# Patient Record
Sex: Female | Born: 1955 | Race: White | Hispanic: No | Marital: Single | Smoking: Current every day smoker
Health system: Southern US, Community
[De-identification: ages and names within clinical notes are randomized; demographics above are authoritative.]

## PROBLEM LIST (undated history)

## (undated) DIAGNOSIS — E785 Hyperlipidemia, unspecified: Secondary | ICD-10-CM

## (undated) DIAGNOSIS — G629 Polyneuropathy, unspecified: Secondary | ICD-10-CM

## (undated) DIAGNOSIS — C50919 Malignant neoplasm of unspecified site of unspecified female breast: Secondary | ICD-10-CM

## (undated) DIAGNOSIS — I10 Essential (primary) hypertension: Secondary | ICD-10-CM

## (undated) DIAGNOSIS — E119 Type 2 diabetes mellitus without complications: Secondary | ICD-10-CM

## (undated) HISTORY — DX: Malignant neoplasm of unspecified site of unspecified female breast: C50.919

## (undated) HISTORY — DX: Polyneuropathy, unspecified: G62.9

## (undated) HISTORY — DX: Essential (primary) hypertension: I10

## (undated) HISTORY — DX: Type 2 diabetes mellitus without complications: E11.9

## (undated) HISTORY — PX: CHOLECYSTECTOMY: SHX55

## (undated) HISTORY — DX: Hyperlipidemia, unspecified: E78.5

## (undated) HISTORY — PX: TUBAL LIGATION: SHX77

---

## 1993-11-09 HISTORY — PX: BREAST RECONSTRUCTION: SHX9

## 1994-01-24 HISTORY — PX: BREAST SURGERY: SHX581

## 2012-04-16 HISTORY — PX: CARDIAC CATHETERIZATION: SHX172

## 2015-03-05 ENCOUNTER — Telehealth: Payer: Self-pay

## 2015-03-05 NOTE — Telephone Encounter (Signed)
See speciality notes 

## 2015-03-06 ENCOUNTER — Ambulatory Visit (INDEPENDENT_AMBULATORY_CARE_PROVIDER_SITE_OTHER): Payer: 59 | Admitting: Family

## 2015-03-06 ENCOUNTER — Encounter: Payer: Self-pay | Admitting: Family

## 2015-03-06 VITALS — BP 96/70 | HR 82 | Temp 98.2°F | Resp 16 | Ht 64.0 in | Wt 156.0 lb

## 2015-03-06 DIAGNOSIS — I10 Essential (primary) hypertension: Secondary | ICD-10-CM

## 2015-03-06 DIAGNOSIS — G43909 Migraine, unspecified, not intractable, without status migrainosus: Secondary | ICD-10-CM | POA: Insufficient documentation

## 2015-03-06 DIAGNOSIS — G43809 Other migraine, not intractable, without status migrainosus: Secondary | ICD-10-CM

## 2015-03-06 DIAGNOSIS — Z0000 Encounter for general adult medical examination without abnormal findings: Secondary | ICD-10-CM | POA: Diagnosis not present

## 2015-03-06 DIAGNOSIS — E785 Hyperlipidemia, unspecified: Secondary | ICD-10-CM | POA: Insufficient documentation

## 2015-03-06 DIAGNOSIS — Z853 Personal history of malignant neoplasm of breast: Secondary | ICD-10-CM

## 2015-03-06 DIAGNOSIS — Z23 Encounter for immunization: Secondary | ICD-10-CM

## 2015-03-06 DIAGNOSIS — E119 Type 2 diabetes mellitus without complications: Secondary | ICD-10-CM

## 2015-03-06 LAB — BASIC METABOLIC PANEL
BUN: 23 mg/dL (ref 6–23)
CHLORIDE: 105 meq/L (ref 96–112)
CO2: 24 meq/L (ref 19–32)
CREATININE: 1.18 mg/dL (ref 0.40–1.20)
Calcium: 10.2 mg/dL (ref 8.4–10.5)
GFR: 49.73 mL/min — ABNORMAL LOW (ref >60.00)
Glucose, Bld: 91 mg/dL (ref 70–99)
POTASSIUM: 4 meq/L (ref 3.5–5.1)
Sodium: 136 mEq/L (ref 135–145)

## 2015-03-06 LAB — HEPATIC FUNCTION PANEL
ALT: 17 U/L (ref 0–35)
AST: 17 U/L (ref 0–37)
Albumin: 4.7 g/dL (ref 3.5–5.2)
Alkaline Phosphatase: 75 U/L (ref 39–117)
BILIRUBIN DIRECT: 0.1 mg/dL (ref 0.0–0.3)
Total Bilirubin: 0.3 mg/dL (ref 0.2–1.2)
Total Protein: 7.5 g/dL (ref 6.0–8.3)

## 2015-03-06 LAB — MICROALBUMIN / CREATININE URINE RATIO
CREATININE, U: 104.6 mg/dL
Microalb Creat Ratio: 0.9 mg/g (ref 0.0–30.0)
Microalb, Ur: 0.9 mg/dL (ref 0.0–1.9)

## 2015-03-06 LAB — LIPID PANEL
CHOL/HDL RATIO: 2
CHOLESTEROL: 146 mg/dL (ref 0–200)
HDL: 65.4 mg/dL (ref >39.00)
LDL Cholesterol: 66 mg/dL (ref 0–99)
NonHDL: 80.6
TRIGLYCERIDES: 74 mg/dL (ref 0.0–149.0)
VLDL: 14.8 mg/dL (ref 0.0–40.0)

## 2015-03-06 LAB — HEMOGLOBIN A1C: HEMOGLOBIN A1C: 7 % — AB (ref 4.6–6.5)

## 2015-03-06 MED ORDER — LISINOPRIL 20 MG PO TABS: 20.0000 mg | ORAL_TABLET | Freq: Every day | ORAL | Status: DC

## 2015-03-06 MED ORDER — ROSUVASTATIN CALCIUM 40 MG PO TABS: 40.0000 mg | ORAL_TABLET | Freq: Every day | ORAL | Status: DC

## 2015-03-06 MED ORDER — TOPIRAMATE 100 MG PO TABS: 200.0000 mg | ORAL_TABLET | Freq: Every day | ORAL | Status: DC

## 2015-03-06 MED ORDER — METFORMIN HCL 1000 MG PO TABS: 1000.0000 mg | ORAL_TABLET | Freq: Two times a day (BID) | ORAL | Status: DC

## 2015-03-06 NOTE — Assessment & Plan Note (Signed)
Repeat manual bp check 98/68, d/c lisinopril-hctz, start plain lisinopril. Follow up in 1 month for bp follow up.

## 2015-03-06 NOTE — Progress Notes (Signed)
Pre visit review using our clinic review tool, if applicable. No additional management support is needed unless otherwise documented below in the visit note. 

## 2015-03-06 NOTE — Assessment & Plan Note (Signed)
Tolerating statin, obtain follow-up lipid panel. 

## 2015-03-06 NOTE — Progress Notes (Signed)
Subjective:    Patient ID: Autumn May, female    DOB: January 26, 1956, 59 y.o.   MRN: [id number]  HPI  Autumn May is a 59 yr old female who presents today to establish care.  Patient presents today for follow up of multiple medical problems.  Diabetes Type 2  Pt was diagnosed 4-5 years ago.   Pt is currently maintained on the following medications for diabetes:metformin Last diabetic eye exam was  Last a1c 09/28/14 was 6.8 + polyuria/polydipsia. Denies hypoglycemia Home glucose readings range rarely checks sugars.   Hyperlipidemia  Patient is currently maintained on the following medication for hyperlipidemia: crestor 40 Patient denies myalgia. Patient reports good compliance with low fat/low cholesterol diet.  Last ldl 108 in november  Hypertension  Patient is currently maintained on the following medications for blood pressure: lisinopril/hctz Patient reports good compliance with blood pressure medications. Patient denies chest pain, shortness of breath or swelling. Last 3 blood pressure readings in our office are as follows: BP Readings from Last 3 Encounters:  03/06/15 96/70   Reports usual BP's around114/72   Migraine Headaches- reports that she is maintained on topamax- has been on for several years and this improved her headache  Breast CA- age 47,  Modified radical mastectomy left  Followed by Chemo.    Reports that she rarely uses meloxicam for occasional hip bursitis and rarely uses valtrex for oral cold sores.  Review of Systems  Constitutional: Negative for unexpected weight change.  HENT: Negative for hearing loss and rhinorrhea.   Eyes: Negative for visual disturbance.  Respiratory: Negative for cough.   Cardiovascular: Negative for leg swelling.  Gastrointestinal: Negative for nausea, diarrhea and constipation.  Genitourinary: Positive for frequency. Negative for dysuria.  Musculoskeletal: Negative for myalgias and arthralgias.  Skin: Negative for  rash.  Neurological: Negative for headaches.       Mild headaches- worse if she is on the computer for work  Hematological: Negative for adenopathy.  Psychiatric/Behavioral: Negative for dysphoric mood and agitation.   Past Medical History  Diagnosis Date  . Hypertension   . Hyperlipidemia   . Breast cancer age: 2  . Diabetes mellitus     type II  . Neuropathy     History   Social History  . Marital Status: Single    Spouse Name: N/A  . Number of Children: N/A  . Years of Education: N/A   Occupational History  . Not on file.   Social History Main Topics  . Smoking status: Current Every Day Smoker -- 1.00 packs/day for 46 years    Types: Cigarettes  . Smokeless tobacco: Never Used  . Alcohol Use: Yes     Comment: 4-5 drinks per month  . Drug Use: No  . Sexual Activity: Not on file   Other Topics Concern  . 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   Enjoys travelling, relaxing          Past Surgical History  Procedure Laterality Date  . Cholecystectomy    . Tubal ligation    . Breast surgery  01/24/94    left mastectomy  . Breast reconstruction  1995    left  . Cardiac catheterization  04/16/12    normal per pt    Family History  Problem Relation Age of Onset  . Stroke Father  Several   . Heart disease Father   . Cancer Father     prostate  . Stroke Sister 65  . Heart disease Sister 61    stents  . Other Mother     brain tumor  . Hyperlipidemia Sister   . Hyperlipidemia Sister     No Known Allergies  No current outpatient prescriptions on file prior to visit.   No current facility-administered medications on file prior to visit.    BP 96/70 mmHg  Pulse 82  Temp(Src) 98.2 F (36.8 C) (Oral)  Resp 16  Ht 5\' 4"  (1.626 m)  Wt 156 lb (70.761 kg)  BMI 26.76 kg/m2  SpO2 94%  LMP 03/06/1995       Objective:   Physical Exam    Constitutional: She is oriented to person, place, and time. She appears well-developed and well-nourished.  HENT:  Head: Normocephalic and atraumatic.  Eyes: No scleral icterus.  Cardiovascular: Normal rate, regular rhythm and normal heart sounds.   No murmur heard. Pulmonary/Chest: Effort normal and breath sounds normal. No respiratory distress. She has no wheezes.  Musculoskeletal: She exhibits no edema.  Neurological: She is alert and oriented to person, place, and time.  Skin: Skin is warm and dry. No rash noted. No erythema. No pallor.  Psychiatric: She has a normal mood and affect. Her behavior is normal. Judgment and thought content normal.          Assessment & Plan:

## 2015-03-06 NOTE — Assessment & Plan Note (Signed)
Obtain a1c, urine microalbumin, bmet, pneumovax today.  Continue metformin.

## 2015-03-06 NOTE — Patient Instructions (Addendum)
Please complete lab work prior to leaving. Stop lisinopril-hctz, start plain lisinopril.  Follow up in  1 month. Welcome to [facility]!

## 2015-03-06 NOTE — Assessment & Plan Note (Signed)
Stable. Continue topamax.

## 2015-03-07 ENCOUNTER — Encounter: Payer: Self-pay | Admitting: Family

## 2015-03-08 ENCOUNTER — Encounter: Payer: Self-pay | Admitting: Family

## 2015-03-08 LAB — URINE CULTURE: Colony Count: 2000

## 2015-04-11 ENCOUNTER — Encounter: Payer: Self-pay | Admitting: Family

## 2015-04-11 ENCOUNTER — Ambulatory Visit (INDEPENDENT_AMBULATORY_CARE_PROVIDER_SITE_OTHER): Payer: 59 | Admitting: Family

## 2015-04-11 VITALS — BP 118/72 | HR 84 | Temp 98.1°F | Resp 16 | Ht 64.0 in | Wt 159.2 lb

## 2015-04-11 DIAGNOSIS — R1013 Epigastric pain: Secondary | ICD-10-CM | POA: Diagnosis not present

## 2015-04-11 DIAGNOSIS — I10 Essential (primary) hypertension: Secondary | ICD-10-CM | POA: Diagnosis not present

## 2015-04-11 DIAGNOSIS — R1010 Upper abdominal pain, unspecified: Secondary | ICD-10-CM

## 2015-04-11 LAB — BASIC METABOLIC PANEL
BUN: 18 mg/dL (ref 6–23)
CALCIUM: 9.4 mg/dL (ref 8.4–10.5)
CO2: 20 meq/L (ref 19–32)
CREATININE: 0.64 mg/dL (ref 0.40–1.20)
Chloride: 107 mEq/L (ref 96–112)
GFR: 100.71 mL/min (ref >60.00)
Glucose, Bld: 118 mg/dL — ABNORMAL HIGH (ref 70–99)
Potassium: 3.9 mEq/L (ref 3.5–5.1)
SODIUM: 136 meq/L (ref 135–145)

## 2015-04-11 LAB — HEPATIC FUNCTION PANEL
ALT: 24 U/L (ref 0–35)
AST: 20 U/L (ref 0–37)
Albumin: 4.5 g/dL (ref 3.5–5.2)
Alkaline Phosphatase: 80 U/L (ref 39–117)
BILIRUBIN DIRECT: 0 mg/dL (ref 0.0–0.3)
BILIRUBIN TOTAL: 0.2 mg/dL (ref 0.2–1.2)
Total Protein: 7.6 g/dL (ref 6.0–8.3)

## 2015-04-11 LAB — LIPASE: LIPASE: 25 U/L (ref 11.0–59.0)

## 2015-04-11 NOTE — Assessment & Plan Note (Signed)
BP looks better on lisinopril only. Continue same, obtain follow up bmet.

## 2015-04-11 NOTE — Assessment & Plan Note (Signed)
Will obtain LFT, lipase and refer for CT abd/pelvis.

## 2015-04-11 NOTE — Addendum Note (Signed)
Addended by: Debbrah Alar on: 04/11/2015 10:09 AM   Modules accepted: Miquel Dunn

## 2015-04-11 NOTE — Patient Instructions (Signed)
Please complete lab work prior to leaving. You will be contacted about your CT scan to evaluate your abdominal discomfort. Follow up in 3 months.

## 2015-04-11 NOTE — Progress Notes (Signed)
Pre visit review using our clinic review tool, if applicable. No additional management support is needed unless otherwise documented below in the visit note. 

## 2015-04-11 NOTE — Progress Notes (Signed)
Subjective:    Patient ID: Autumn May, female    DOB: 11/11/1955, 59 y.o.   MRN: [id number]  HPI  Autumn May is a 59 yr old female who presents today for follow up of hypertension. Last visit her BP was low and lisinopril-hctz was changed to lisinopril.   BP Readings from Last 3 Encounters:  04/11/15 118/72  03/06/15 96/70   Abdominal bloating- reports that his is a chronic problem (several years), but she develops periods of epigastric bloating.  Not related to or worsened by dietary intake or foods.  Has intermittent bulging of upper abdomen and reports that abdomen becomes very rigid and uncomfortable. Discomfort can be moderate to severe in nature.The only thing that helps her pain is to undress and to lay flat.  Reports she saw GI for this about 3 years ago and work up was negative.     Review of Systems See HPI  Past Medical History  Diagnosis Date  . Hypertension   . Hyperlipidemia   . Breast cancer age: 89  . Diabetes mellitus     type II  . Neuropathy     History   Social History  . Marital Status: Single    Spouse Name: N/A  . Number of Children: N/A  . Years of Education: N/A   Occupational History  . Not on file.   Social History Main Topics  . Smoking status: Current Every Day Smoker -- 1.00 packs/day for 46 years    Types: Cigarettes  . Smokeless tobacco: Never Used  . Alcohol Use: Yes     Comment: 4-5 drinks per month  . Drug Use: No  . Sexual Activity: Not on file   Other Topics Concern  . 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   Enjoys travelling, relaxing          Past Surgical History  Procedure Laterality Date  . Cholecystectomy    . Tubal ligation    . Breast surgery  01/24/94    left mastectomy  . Breast reconstruction  1995    left  . Cardiac catheterization  04/16/12    normal per pt    Family History  Problem Relation Age  of Onset  . Stroke Father     Several   . Heart disease Father   . Cancer Father     prostate  . Stroke Sister 54  . Heart disease Sister 23    stents  . Other Mother     brain tumor  . Hyperlipidemia Sister   . Hyperlipidemia Sister     No Known Allergies  Current Outpatient Prescriptions on File Prior to Visit  Medication Sig Dispense Refill  . glucose blood test strip 1 each by Other route daily as needed for other. Use as instructed    . Lancets (FREESTYLE) lancets 1 each by Other route as needed for other. Use as instructed    . lisinopril (PRINIVIL,ZESTRIL) 20 MG tablet Take 1 tablet (20 mg total) by mouth daily. 30 tablet 2  . meloxicam (MOBIC) 15 MG tablet Take 15 mg by mouth daily as needed for pain.    . metFORMIN (GLUCOPHAGE) 1000 MG tablet Take 1 tablet (1,000 mg total) by mouth 2 (two) times daily with a meal. 60 tablet 5  . rosuvastatin (CRESTOR) 40 MG tablet Take 1 tablet (  40 mg total) by mouth daily. 30 tablet 5  . topiramate (TOPAMAX) 100 MG tablet Take 2 tablets (200 mg total) by mouth at bedtime. 60 tablet 5  . valACYclovir (VALTREX) 500 MG tablet Take 500 mg by mouth 2 (two) times daily as needed.     No current facility-administered medications on file prior to visit.    BP 118/72 mmHg  Pulse 84  Temp(Src) 98.1 F (36.7 C) (Oral)  Resp 16  Ht 5\' 4"  (1.626 m)  Wt 159 lb 3.2 oz (72.213 kg)  BMI 27.31 kg/m2  SpO2 97%       Objective:   Physical Exam  Constitutional: She is oriented to person, place, and time. She appears well-developed and well-nourished. No distress.  HENT:  Head: Normocephalic and atraumatic.  Eyes: No scleral icterus.  Musculoskeletal: She exhibits no edema.  Neurological: She is alert and oriented to person, place, and time.  Skin: Skin is warm and dry.  Psychiatric: She has a normal mood and affect. Her behavior is normal. Judgment and thought content normal.          Assessment & Plan:

## 2015-04-13 ENCOUNTER — Encounter (HOSPITAL_BASED_OUTPATIENT_CLINIC_OR_DEPARTMENT_OTHER): Payer: Self-pay

## 2015-04-13 ENCOUNTER — Ambulatory Visit (HOSPITAL_BASED_OUTPATIENT_CLINIC_OR_DEPARTMENT_OTHER)
Admission: RE | Admit: 2015-04-13 | Discharge: 2015-04-13 | Disposition: A | Discharge status: Home / Self Care | Payer: 59 | Source: Ambulatory Visit | Attending: Family | Admitting: Family

## 2015-04-13 DIAGNOSIS — Z9049 Acquired absence of other specified parts of digestive tract: Secondary | ICD-10-CM | POA: Insufficient documentation

## 2015-04-13 DIAGNOSIS — I700 Atherosclerosis of aorta: Secondary | ICD-10-CM | POA: Diagnosis not present

## 2015-04-13 DIAGNOSIS — R1010 Upper abdominal pain, unspecified: Secondary | ICD-10-CM | POA: Diagnosis not present

## 2015-04-13 MED ORDER — IOHEXOL 300 MG/ML  SOLN
100.0000 mL | Freq: Once | INTRAMUSCULAR | Status: AC | PRN
  Administered 2015-04-13: 100 mL via INTRAVENOUS

## 2015-04-15 ENCOUNTER — Telehealth: Payer: Self-pay | Admitting: Family

## 2015-04-15 ENCOUNTER — Encounter: Payer: Self-pay | Admitting: Physician Assistant

## 2015-04-15 ENCOUNTER — Other Ambulatory Visit (INDEPENDENT_AMBULATORY_CARE_PROVIDER_SITE_OTHER): Payer: 59

## 2015-04-15 DIAGNOSIS — R1013 Epigastric pain: Secondary | ICD-10-CM

## 2015-04-15 DIAGNOSIS — R739 Hyperglycemia, unspecified: Secondary | ICD-10-CM | POA: Diagnosis not present

## 2015-04-15 NOTE — Telephone Encounter (Signed)
Notified pt. She will return at 4pm today for lab. Order entered. Pt will see GI on 05/09/15.

## 2015-04-15 NOTE — Telephone Encounter (Signed)
Please let pt know that abdominal CT looks ok and labs look ok, except sugar elevated.  I would like to obtain A1C please (can we add it on?).  I would like to refer her to GI for further evaluation of her pain.

## 2015-04-16 ENCOUNTER — Encounter: Payer: Self-pay | Admitting: Family

## 2015-04-16 LAB — HEMOGLOBIN A1C: HEMOGLOBIN A1C: 6.8 % — AB (ref 4.6–6.5)

## 2015-04-29 ENCOUNTER — Ambulatory Visit: Payer: Self-pay | Admitting: Family

## 2015-05-09 ENCOUNTER — Ambulatory Visit: Payer: Self-pay | Admitting: Physician Assistant

## 2015-05-13 ENCOUNTER — Other Ambulatory Visit: Payer: Self-pay | Admitting: Family

## 2015-07-12 ENCOUNTER — Ambulatory Visit (INDEPENDENT_AMBULATORY_CARE_PROVIDER_SITE_OTHER): Payer: 59 | Admitting: Family

## 2015-07-12 ENCOUNTER — Encounter: Payer: Self-pay | Admitting: Family

## 2015-07-12 VITALS — BP 90/60 | HR 87 | Temp 98.1°F | Resp 16 | Ht 64.0 in | Wt 155.0 lb

## 2015-07-12 DIAGNOSIS — I10 Essential (primary) hypertension: Secondary | ICD-10-CM

## 2015-07-12 DIAGNOSIS — E785 Hyperlipidemia, unspecified: Secondary | ICD-10-CM | POA: Diagnosis not present

## 2015-07-12 DIAGNOSIS — Z23 Encounter for immunization: Secondary | ICD-10-CM | POA: Diagnosis not present

## 2015-07-12 DIAGNOSIS — E119 Type 2 diabetes mellitus without complications: Secondary | ICD-10-CM

## 2015-07-12 LAB — BASIC METABOLIC PANEL
BUN: 14 mg/dL (ref 6–23)
CO2: 24 meq/L (ref 19–32)
Calcium: 9.6 mg/dL (ref 8.4–10.5)
Chloride: 110 mEq/L (ref 96–112)
Creatinine, Ser: 0.68 mg/dL (ref 0.40–1.20)
GFR: 93.82 mL/min (ref >60.00)
GLUCOSE: 113 mg/dL — AB (ref 70–99)
POTASSIUM: 4.1 meq/L (ref 3.5–5.1)
SODIUM: 142 meq/L (ref 135–145)

## 2015-07-12 LAB — HEMOGLOBIN A1C: Hgb A1c MFr Bld: 6.9 % — ABNORMAL HIGH (ref 4.6–6.5)

## 2015-07-12 MED ORDER — TOPIRAMATE 100 MG PO TABS: 200.0000 mg | ORAL_TABLET | Freq: Every day | ORAL | Status: DC

## 2015-07-12 MED ORDER — ROSUVASTATIN CALCIUM 40 MG PO TABS: 40.0000 mg | ORAL_TABLET | Freq: Every day | ORAL | Status: DC

## 2015-07-12 MED ORDER — LISINOPRIL 20 MG PO TABS: 10.0000 mg | ORAL_TABLET | Freq: Every day | ORAL | Status: DC

## 2015-07-12 NOTE — Assessment & Plan Note (Signed)
BP overtreated. Decrease lisinopril from 20mg  to 10mg .

## 2015-07-12 NOTE — Progress Notes (Signed)
Subjective:    Patient ID: Autumn May, female    DOB: 01-10-1956, 59 y.o.   MRN: [id number]  HPI  [name] is a 59 yr old female who presents today for follow up.   BP Readings from Last 3 Encounters:  07/12/15 90/60  04/11/15 118/72  03/06/15 96/70   Hyperlipidemia- denies myalgia Lab Results  Component Value Date   CHOL 146 03/06/2015   HDL 65.40 03/06/2015   LDLCALC 66 03/06/2015   TRIG 74.0 03/06/2015   CHOLHDL 2 03/06/2015   DM2- she is on metformin. She reports sugars in 120's., no hypoglycemia.  Diet is stable   Lab Results  Component Value Date   HGBA1C 6.8* 04/15/2015   HGBA1C 7.0* 03/06/2015   Lab Results  Component Value Date   MICROALBUR 0.9 03/06/2015   LDLCALC 66 03/06/2015   CREATININE 0.64 04/11/2015    Wt Readings from Last 3 Encounters:  07/12/15 155 lb (70.308 kg)  04/11/15 159 lb 3.2 oz (72.213 kg)  03/06/15 156 lb (70.761 kg)      Review of Systems Past Medical History  Diagnosis Date  . Hypertension   . Hyperlipidemia   . Breast cancer age: 59  . Diabetes mellitus     type II  . Neuropathy     Social History   Social History  . Marital Status: Single    Spouse Name: N/A  . Number of Children: N/A  . Years of Education: N/A   Occupational History  . Not on file.   Social History Main Topics  . Smoking status: Current Every Day Smoker -- 1.00 packs/day for 46 years    Types: Cigarettes  . Smokeless tobacco: Never Used  . Alcohol Use: Yes     Comment: 4-5 drinks per month  . Drug Use: No  . Sexual Activity: Not on file   Other Topics Concern  . 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   Enjoys travelling, relaxing          Past Surgical History  Procedure Laterality Date  . Cholecystectomy    . Tubal ligation    . Breast surgery  01/24/94    left mastectomy  . Breast reconstruction  1995    left  .  Cardiac catheterization  04/16/12    normal per pt    Family History  Problem Relation Age of Onset  . Stroke Father     Several   . Heart disease Father   . Cancer Father     prostate  . Stroke Sister 8  . Heart disease Sister 11    stents  . Other Mother     brain tumor  . Hyperlipidemia Sister   . Hyperlipidemia Sister     No Known Allergies  Current Outpatient Prescriptions on File Prior to Visit  Medication Sig Dispense Refill  . glucose blood test strip 1 each by Other route daily as needed for other. Use as instructed    . Lancets (FREESTYLE) lancets 1 each by Other route as needed for other. Use as instructed    . lisinopril (PRINIVIL,ZESTRIL) 20 MG tablet TAKE 1 TABLET (20 MG TOTAL) BY MOUTH DAILY. 30 tablet 2  . meloxicam (MOBIC) 15 MG tablet Take 15 mg by mouth daily as needed for pain.    . metFORMIN (GLUCOPHAGE) 1000 MG tablet  Take 1 tablet (1,000 mg total) by mouth 2 (two) times daily with a meal. 60 tablet 5  . rosuvastatin (CRESTOR) 40 MG tablet Take 1 tablet (40 mg total) by mouth daily. 30 tablet 5  . topiramate (TOPAMAX) 100 MG tablet Take 2 tablets (200 mg total) by mouth at bedtime. 60 tablet 5  . valACYclovir (VALTREX) 500 MG tablet Take 500 mg by mouth 2 (two) times daily as needed.     No current facility-administered medications on file prior to visit.    BP 90/60 mmHg  Pulse 87  Temp(Src) 98.1 F (36.7 C) (Oral)  Resp 16  Ht 5\' 4"  (1.626 m)  Wt 155 lb (70.308 kg)  BMI 26.59 kg/m2  SpO2 97%       Objective:   Physical Exam  Constitutional: She appears well-developed and well-nourished.  Cardiovascular: Normal rate, regular rhythm and normal heart sounds.   No murmur heard. Pulmonary/Chest: Effort normal and breath sounds normal. No respiratory distress. She has no wheezes.  Psychiatric: She has a normal mood and affect. Her behavior is normal. Judgment and thought content normal.          Assessment & Plan:

## 2015-07-12 NOTE — Patient Instructions (Signed)
Please complete lab work prior to leaving. Decrease lisinopril 20mg  1/2 tab by mouth once daily. Follow up in 1 month for nurse visit BP check, 3 months for office visit.

## 2015-07-12 NOTE — Assessment & Plan Note (Signed)
Stable on metformin, obtain A1C and bmet Flu shot today

## 2015-07-12 NOTE — Assessment & Plan Note (Signed)
At goal, continue crestor.  

## 2015-07-12 NOTE — Progress Notes (Signed)
Pre visit review using our clinic review tool, if applicable. No additional management support is needed unless otherwise documented below in the visit note. 

## 2015-07-15 ENCOUNTER — Encounter: Payer: Self-pay | Admitting: Family

## 2015-07-19 IMAGING — CT CT ABD-PELV W/ CM
2 of 5 series · 17 of 46 positions shown, 19 images · IV contrast (omnipaque)
Comparison: None available

CLINICAL DATA: Acute upper abdominal pain, progressive throughout
the day, history of abdominal hernia repair

EXAM:
CT ABDOMEN AND PELVIS WITH CONTRAST
TECHNIQUE: Multidetector CT imaging of the abdomen and pelvis was performed
using the standard protocol following bolus administration of
intravenous contrast.
CONTRAST:  100mL OMNIPAQUE IOHEXOL 300 MG/ML  SOLN

[Series 2: abd/pelvis 5.0 b31f · axial · 0.74mm/px · z∈[+666,+1076]mm · 14 of 92 slices shown, 16 images]
[im 5/92  soft-tissue]
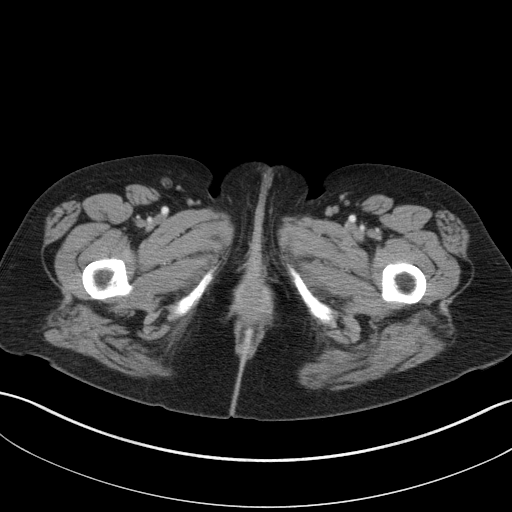
[im 5/92  bone]
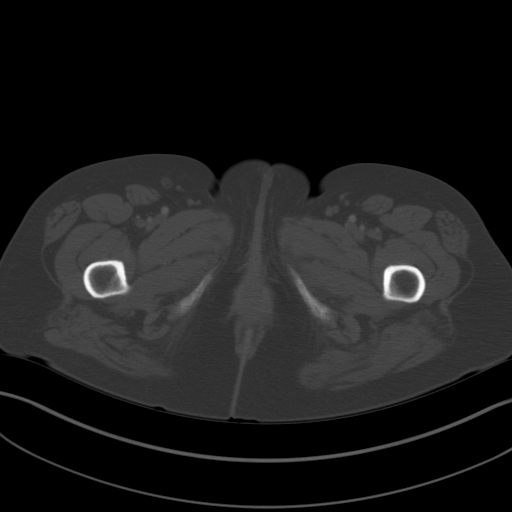
[im 10/92  soft-tissue]
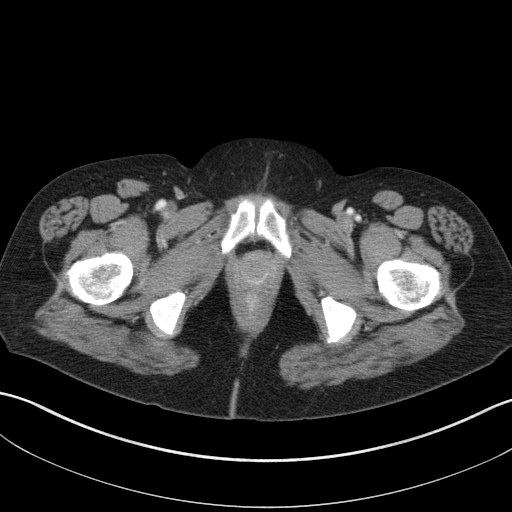
[im 20/92  soft-tissue]
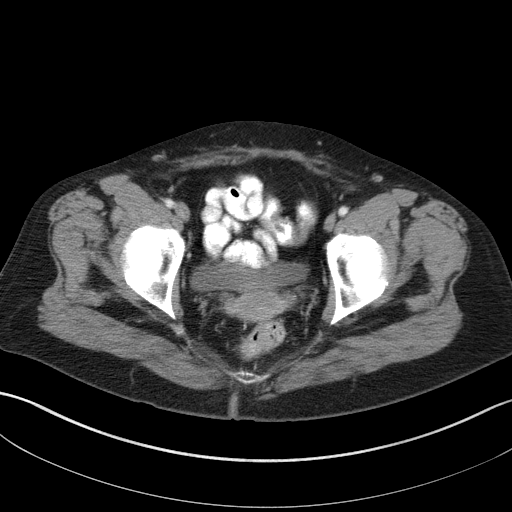
[im 24/92  soft-tissue]
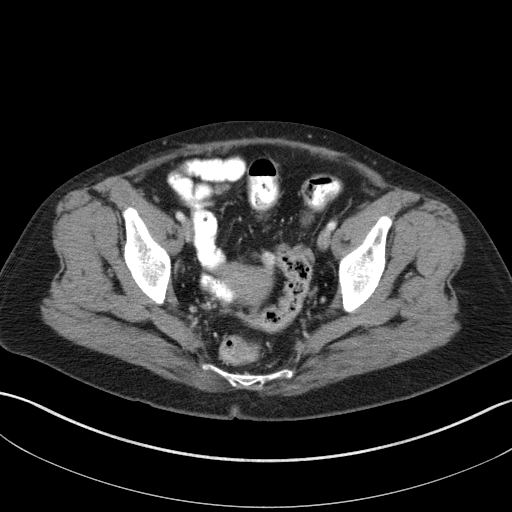
[im 29/92  soft-tissue]
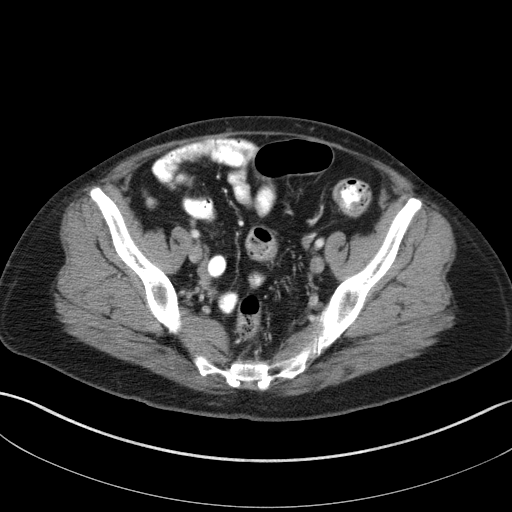
[im 39/92  soft-tissue]
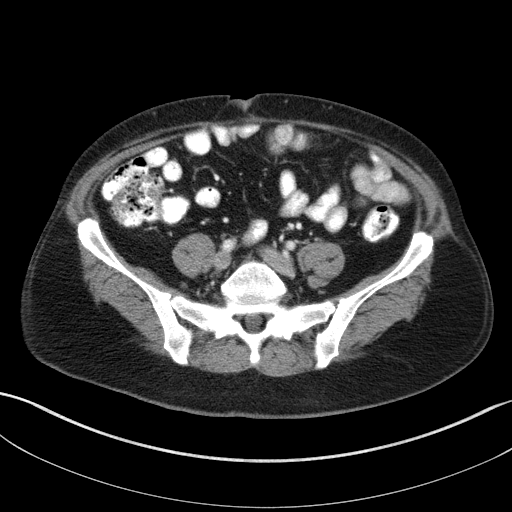
[im 44/92  soft-tissue]
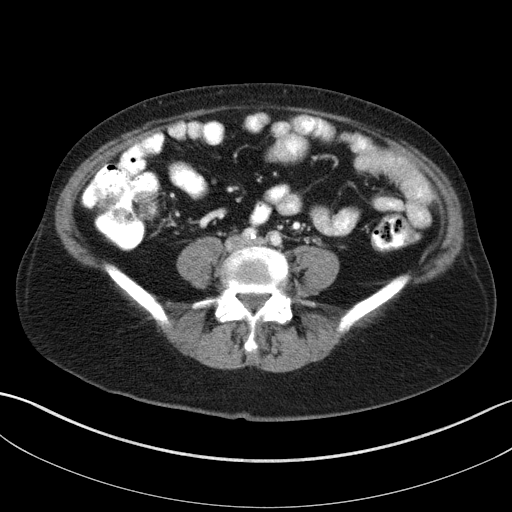
[im 48/92  soft-tissue]
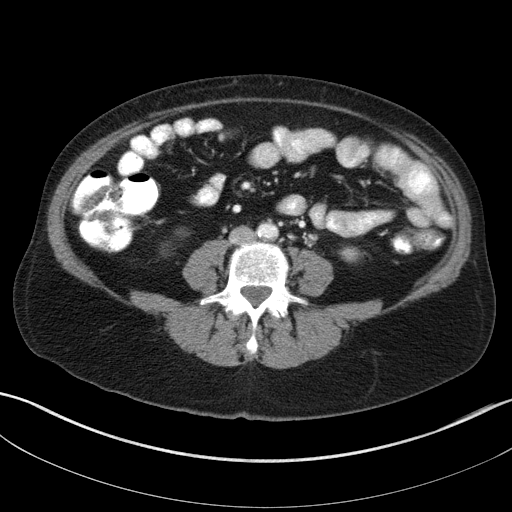
[im 53/92  soft-tissue]
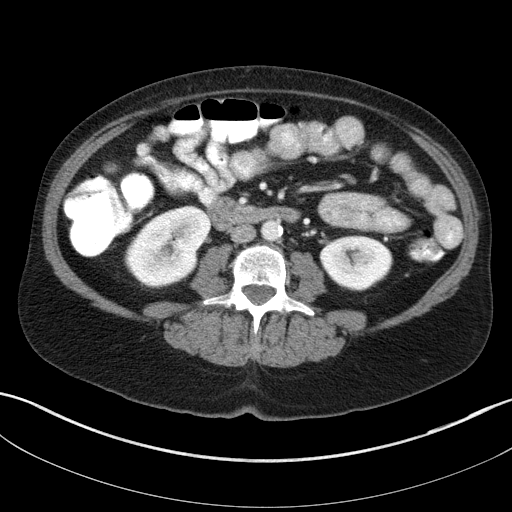
[im 53/92  bone]
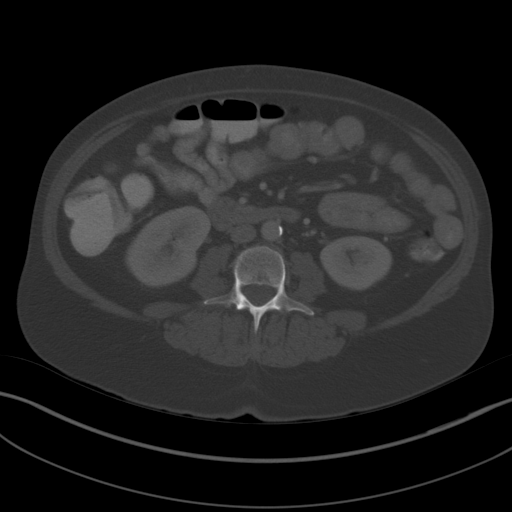
[im 63/92  soft-tissue]
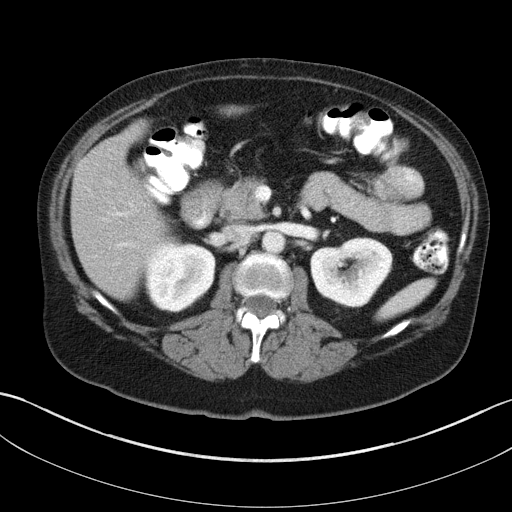
[im 68/92  soft-tissue]
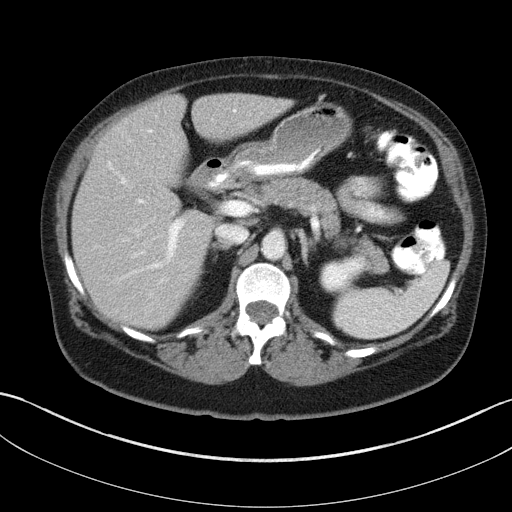
[im 72/92  soft-tissue]
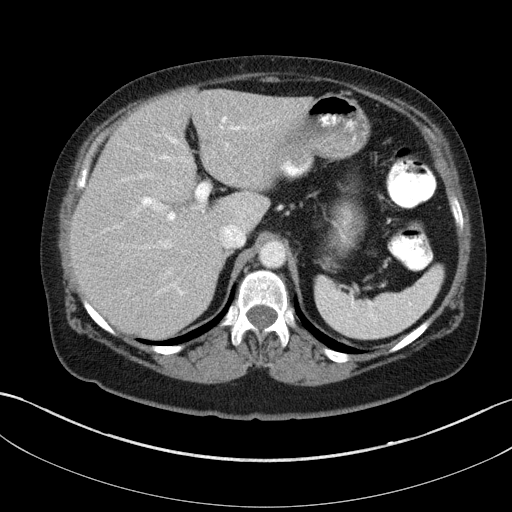
[im 82/92  soft-tissue]
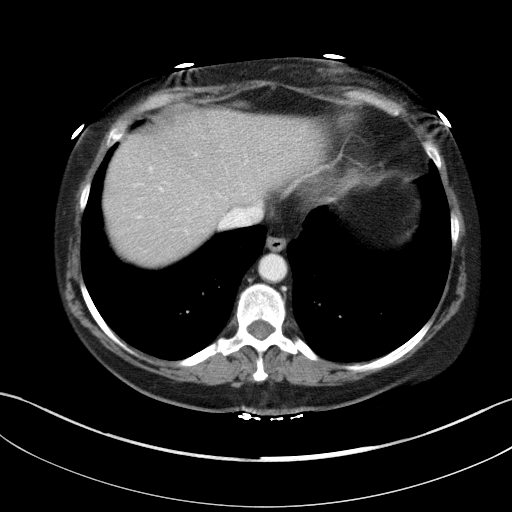
[im 87/92  soft-tissue]
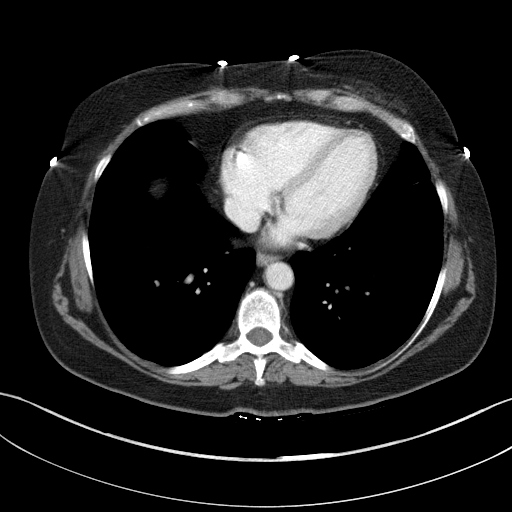

[Series 5: abd/pelvis 3.0 coronal · coronal · 0.87mm/px · 3 of 85 slices shown]
[im 29/85  soft-tissue]
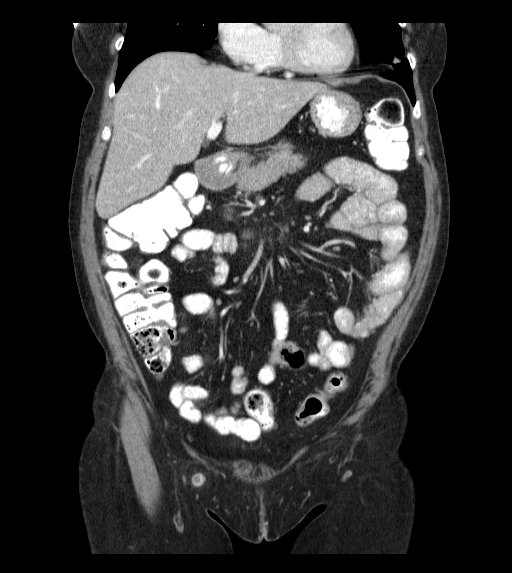
[im 38/85  soft-tissue]
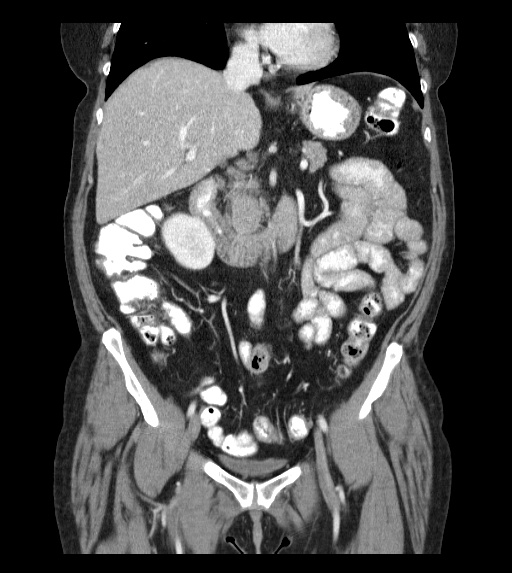
[im 47/85  soft-tissue]
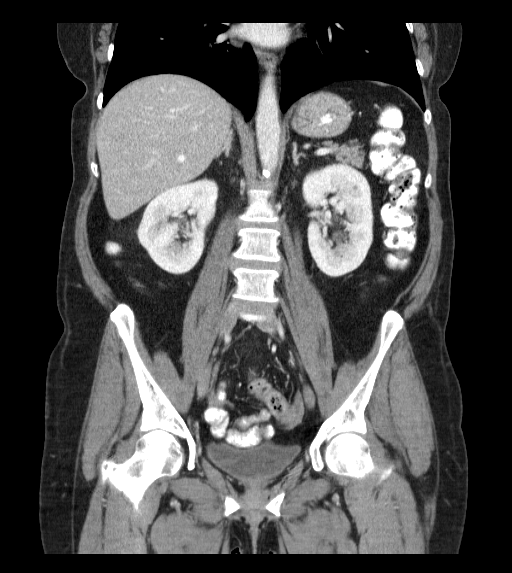

[17 of 46 positions shown; findings below may reference images not displayed]

FINDINGS: Lower chest: Minor inferior lingula and bibasilar atelectasis versus
scarring. No pericardial or pleural effusion. Normal heart size. No
hiatal hernia.

Abdomen: Prior cholecystectomy. Liver, biliary system, pancreas,
spleen, adrenal glands, and kidneys are within normal limits for age
and demonstrate no acute process.

Negative for bowel obstruction, dilatation, ileus, or free air.

No abdominal free fluid, fluid collection, hemorrhage, abscess, or
adenopathy.

Abdominal aortic atherosclerosis without aneurysm or occlusive
process.

Normal appendix in the right lower quadrant.

Pelvis: No pelvic free fluid, fluid collection, hemorrhage, abscess,
adenopathy, inguinal abnormality, or hernia. Urinary bladder is
underdistended. Uterus and adnexal normal in size. No acute distal
bowel process.

No acute osseous finding. Minor L5-S1 facet arthropathy. No pars
defects.
IMPRESSION: No acute intra-abdominal or pelvic finding by CT.

Minor lingula and bibasilar atelectasis versus scarring

Prior cholecystectomy

Aortic atherosclerosis without aneurysm.

## 2015-08-13 ENCOUNTER — Other Ambulatory Visit: Payer: Self-pay | Admitting: Family

## 2015-08-14 ENCOUNTER — Ambulatory Visit (INDEPENDENT_AMBULATORY_CARE_PROVIDER_SITE_OTHER): Payer: 59 | Admitting: Family

## 2015-08-14 VITALS — BP 119/75 | HR 85

## 2015-08-14 DIAGNOSIS — I10 Essential (primary) hypertension: Secondary | ICD-10-CM

## 2015-08-14 LAB — BASIC METABOLIC PANEL
BUN: 18 mg/dL (ref 6–23)
CO2: 24 meq/L (ref 19–32)
Calcium: 9.7 mg/dL (ref 8.4–10.5)
Chloride: 107 mEq/L (ref 96–112)
Creatinine, Ser: 0.66 mg/dL (ref 0.40–1.20)
GFR: 97.08 mL/min (ref >60.00)
Glucose, Bld: 101 mg/dL — ABNORMAL HIGH (ref 70–99)
Potassium: 4.3 mEq/L (ref 3.5–5.1)
SODIUM: 141 meq/L (ref 135–145)

## 2015-08-14 NOTE — Progress Notes (Signed)
Pre visit review using our clinic review tool, if applicable. No additional management support is needed unless otherwise documented below in the visit note.  Patient in for BP check

## 2015-08-14 NOTE — Progress Notes (Signed)
   Subjective:    Patient ID: Autumn May, female    DOB: Apr 19, 1956, 59 y.o.   MRN: [id number]  HPI    Review of Systems     Objective:   Physical Exam        Assessment & Plan:  bp noted. Continue current meds.

## 2015-10-11 ENCOUNTER — Encounter: Payer: Self-pay | Admitting: Family

## 2015-10-11 ENCOUNTER — Ambulatory Visit (INDEPENDENT_AMBULATORY_CARE_PROVIDER_SITE_OTHER): Payer: 59 | Admitting: Family

## 2015-10-11 VITALS — HR 79 | Temp 97.9°F | Resp 18 | Ht 64.0 in | Wt 157.6 lb

## 2015-10-11 DIAGNOSIS — E119 Type 2 diabetes mellitus without complications: Secondary | ICD-10-CM | POA: Diagnosis not present

## 2015-10-11 DIAGNOSIS — E1110 Type 2 diabetes mellitus with ketoacidosis without coma: Secondary | ICD-10-CM

## 2015-10-11 DIAGNOSIS — E785 Hyperlipidemia, unspecified: Secondary | ICD-10-CM | POA: Diagnosis not present

## 2015-10-11 DIAGNOSIS — E1310 Other specified diabetes mellitus with ketoacidosis without coma: Secondary | ICD-10-CM | POA: Diagnosis not present

## 2015-10-11 DIAGNOSIS — G43809 Other migraine, not intractable, without status migrainosus: Secondary | ICD-10-CM | POA: Diagnosis not present

## 2015-10-11 LAB — BASIC METABOLIC PANEL
BUN: 13 mg/dL (ref 6–23)
CO2: 26 meq/L (ref 19–32)
CREATININE: 0.65 mg/dL (ref 0.40–1.20)
Calcium: 9.7 mg/dL (ref 8.4–10.5)
Chloride: 109 mEq/L (ref 96–112)
GFR: 98.76 mL/min (ref >60.00)
GLUCOSE: 122 mg/dL — AB (ref 70–99)
Potassium: 4.4 mEq/L (ref 3.5–5.1)
SODIUM: 142 meq/L (ref 135–145)

## 2015-10-11 LAB — HEMOGLOBIN A1C: Hgb A1c MFr Bld: 7 % — ABNORMAL HIGH (ref 4.6–6.5)

## 2015-10-11 MED ORDER — ASPIRIN 81 MG PO TABS: 81.0000 mg | ORAL_TABLET | Freq: Every day | ORAL | Status: DC

## 2015-10-11 MED ORDER — METFORMIN HCL 1000 MG PO TABS: ORAL_TABLET | ORAL | Status: DC

## 2015-10-11 NOTE — Assessment & Plan Note (Signed)
Clinically stable, obtain follow up A1C. Continue metformin

## 2015-10-11 NOTE — Assessment & Plan Note (Signed)
Stable on topamax, continue same, obtain follow up bmet.  

## 2015-10-11 NOTE — Progress Notes (Signed)
Subjective:    Patient ID: Autumn May, female    DOB: 1956/04/22, 59 y.o.   MRN: [id number]  HPI  Diabetes- currently maintained on metformin.  Lab Results  Component Value Date   HGBA1C 6.9* 07/12/2015   HGBA1C 6.8* 04/15/2015   HGBA1C 7.0* 03/06/2015   Lab Results  Component Value Date   MICROALBUR 0.9 03/06/2015   LDLCALC 66 03/06/2015   CREATININE 0.66 08/14/2015   HTN- currently maintained on lisinopril 20mg .  BP Readings from Last 3 Encounters:  08/14/15 119/75  07/12/15 90/60  04/11/15 118/72   Hyperlipidemia- maintained on crestor.   Lab Results  Component Value Date   CHOL 146 03/06/2015   HDL 65.40 03/06/2015   LDLCALC 66 03/06/2015   TRIG 74.0 03/06/2015   CHOLHDL 2 03/06/2015   Migraines- currently maintained on topamax.     Review of Systems  Respiratory: Negative for shortness of breath.   Cardiovascular: Negative for chest pain.  Musculoskeletal: Negative for myalgias.  Neurological:       Reports migraines have been well controlled.   Past Medical History  Diagnosis Date  . Hypertension   . Hyperlipidemia   . Breast cancer [facility]) age: 51  . Diabetes mellitus ([place])     type II  . Neuropathy [facility])     Social History   Social History  . Marital Status: Single    Spouse Name: N/A  . Number of Children: N/A  . Years of Education: N/A   Occupational History  . Not on file.   Social History Main Topics  . Smoking status: Current Every Day Smoker -- 1.00 packs/day for 46 years    Types: Cigarettes  . Smokeless tobacco: Never Used  . Alcohol Use: Yes     Comment: 4-5 drinks per month  . Drug Use: No  . Sexual Activity: Not on file   Other Topics Concern  . 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   Enjoys travelling, relaxing          Past Surgical History  Procedure Laterality Date  . Cholecystectomy    . Tubal ligation      . Breast surgery  01/24/94    left mastectomy  . Breast reconstruction  1995    left  . Cardiac catheterization  04/16/12    normal per pt    Family History  Problem Relation Age of Onset  . Stroke Father     Several   . Heart disease Father   . Cancer Father     prostate  . Stroke Sister 84  . Heart disease Sister 35    stents  . Other Mother     brain tumor  . Hyperlipidemia Sister   . Hyperlipidemia Sister     No Known Allergies  Current Outpatient Prescriptions on File Prior to Visit  Medication Sig Dispense Refill  . glucose blood test strip 1 each by Other route daily as needed for other. Use as instructed    . Lancets (FREESTYLE) lancets 1 each by Other route as needed for other. Use as instructed    . lisinopril (PRINIVIL,ZESTRIL) 20 MG tablet Take 0.5 tablets (10 mg total) by mouth daily. 45 tablet 1  . meloxicam (MOBIC) 15 MG tablet Take 15 mg by mouth daily as needed for pain.    . rosuvastatin (CRESTOR)  40 MG tablet Take 1 tablet (40 mg total) by mouth daily. 30 tablet 5  . topiramate (TOPAMAX) 100 MG tablet Take 2 tablets (200 mg total) by mouth at bedtime. 60 tablet 5  . valACYclovir (VALTREX) 500 MG tablet Take 500 mg by mouth 2 (two) times daily as needed.     No current facility-administered medications on file prior to visit.    Pulse 79  Temp(Src) 97.9 F (36.6 C) (Oral)  Resp 18  Ht 5\' 4"  (1.626 m)  Wt 157 lb 9.6 oz (71.487 kg)  BMI 27.04 kg/m2  SpO2 97%       Objective:   Physical Exam  Constitutional: She is oriented to person, place, and time. She appears well-developed and well-nourished.  HENT:  Head: Normocephalic and atraumatic.  Eyes: No scleral icterus.  Cardiovascular: Normal rate, regular rhythm and normal heart sounds.   No murmur heard. Pulmonary/Chest: Effort normal and breath sounds normal. No respiratory distress. She has no wheezes.  Abdominal: She exhibits no mass.  Musculoskeletal: She exhibits no edema.   Neurological: She is alert and oriented to person, place, and time.  Psychiatric: She has a normal mood and affect. Her behavior is normal. Judgment and thought content normal.          Assessment & Plan:  She will check zostavax coverage with her insurance.  Plan Td and zostavax next visit.

## 2015-10-11 NOTE — Patient Instructions (Addendum)
Please complete lab work prior to leaving. Add aspirin 81mg  once daily.   Check with your insurance re: coverage for zostavax.

## 2015-10-11 NOTE — Assessment & Plan Note (Signed)
At goal on crestor. Continue same.

## 2015-10-11 NOTE — Progress Notes (Signed)
Pre visit review using our clinic review tool, if applicable. No additional management support is needed unless otherwise documented below in the visit note. 

## 2015-10-13 ENCOUNTER — Encounter: Payer: Self-pay | Admitting: Family

## 2015-10-13 ENCOUNTER — Other Ambulatory Visit: Payer: Self-pay | Admitting: Family

## 2016-01-10 ENCOUNTER — Ambulatory Visit (INDEPENDENT_AMBULATORY_CARE_PROVIDER_SITE_OTHER): Payer: 59 | Admitting: Family

## 2016-01-10 ENCOUNTER — Encounter: Payer: Self-pay | Admitting: Family

## 2016-01-10 VITALS — BP 124/73 | HR 80 | Temp 97.8°F | Resp 16 | Ht 64.0 in | Wt 158.0 lb

## 2016-01-10 DIAGNOSIS — Z23 Encounter for immunization: Secondary | ICD-10-CM | POA: Diagnosis not present

## 2016-01-10 DIAGNOSIS — E119 Type 2 diabetes mellitus without complications: Secondary | ICD-10-CM | POA: Diagnosis not present

## 2016-01-10 DIAGNOSIS — I10 Essential (primary) hypertension: Secondary | ICD-10-CM | POA: Diagnosis not present

## 2016-01-10 DIAGNOSIS — Z0000 Encounter for general adult medical examination without abnormal findings: Secondary | ICD-10-CM

## 2016-01-10 DIAGNOSIS — E785 Hyperlipidemia, unspecified: Secondary | ICD-10-CM

## 2016-01-10 LAB — BASIC METABOLIC PANEL
BUN: 16 mg/dL (ref 6–23)
CO2: 25 meq/L (ref 19–32)
Calcium: 9.5 mg/dL (ref 8.4–10.5)
Chloride: 110 mEq/L (ref 96–112)
Creatinine, Ser: 0.69 mg/dL (ref 0.40–1.20)
GFR: 92.1 mL/min (ref >60.00)
GLUCOSE: 135 mg/dL — AB (ref 70–99)
POTASSIUM: 4.4 meq/L (ref 3.5–5.1)
Sodium: 142 mEq/L (ref 135–145)

## 2016-01-10 LAB — LIPID PANEL
CHOL/HDL RATIO: 2
Cholesterol: 146 mg/dL (ref 0–200)
HDL: 65.2 mg/dL (ref >39.00)
LDL Cholesterol: 69 mg/dL (ref 0–99)
NONHDL: 81.26
Triglycerides: 61 mg/dL (ref 0.0–149.0)
VLDL: 12.2 mg/dL (ref 0.0–40.0)

## 2016-01-10 LAB — MICROALBUMIN / CREATININE URINE RATIO
Creatinine,U: 140.9 mg/dL
Microalb Creat Ratio: 0.6 mg/g (ref 0.0–30.0)
Microalb, Ur: 0.9 mg/dL (ref 0.0–1.9)

## 2016-01-10 LAB — HEMOGLOBIN A1C: Hgb A1c MFr Bld: 7.1 % — ABNORMAL HIGH (ref 4.6–6.5)

## 2016-01-10 MED ORDER — LISINOPRIL 20 MG PO TABS: 10.0000 mg | ORAL_TABLET | Freq: Every day | ORAL | Status: DC

## 2016-01-10 MED ORDER — ROSUVASTATIN CALCIUM 40 MG PO TABS: 40.0000 mg | ORAL_TABLET | Freq: Every day | ORAL | Status: DC

## 2016-01-10 NOTE — Progress Notes (Signed)
Pre visit review using our clinic review tool, if applicable. No additional management support is needed unless otherwise documented below in the visit note. 

## 2016-01-10 NOTE — Assessment & Plan Note (Signed)
BP stable on lisinopril, continue same, obtain bmet.  

## 2016-01-10 NOTE — Assessment & Plan Note (Signed)
Clinically stable on metformin, continue same, obtain a1c and urine microalbumin, continue ACE for renal protection.

## 2016-01-10 NOTE — Assessment & Plan Note (Signed)
Tolerating statin, reports insurance transitioned her to generic crestor.  She is worried cholesterol is back up.  Obtain follow up flp.

## 2016-01-10 NOTE — Patient Instructions (Signed)
Please complete lab work prior to leaving.   

## 2016-01-10 NOTE — Progress Notes (Signed)
Subjective:    Patient ID: Autumn May, female    DOB: 1956/07/19, 60 y.o.   MRN: [id number]  HPI  Autumn May is a 60 yr old female who presents today for follow up.  1) DM2- currently maintained on metformin. Not checking sugars at home Lab Results  Component Value Date   HGBA1C 7.0* 10/11/2015   HGBA1C 6.9* 07/12/2015   HGBA1C 6.8* 04/15/2015   Lab Results  Component Value Date   MICROALBUR 0.9 03/06/2015   LDLCALC 66 03/06/2015   CREATININE 0.65 10/11/2015   2) HTN- on lisinopril.  BP Readings from Last 3 Encounters:  01/10/16 124/73  08/14/15 119/75  07/12/15 90/60   3) Hyperlipidemia- on crestor. Lab Results  Component Value Date   CHOL 146 03/06/2015   HDL 65.40 03/06/2015   LDLCALC 66 03/06/2015   TRIG 74.0 03/06/2015   CHOLHDL 2 03/06/2015      Review of Systems  Respiratory: Negative for cough and shortness of breath.   Cardiovascular: Negative for chest pain and leg swelling.  Musculoskeletal: Negative for myalgias.      see HPI  Past Medical History  Diagnosis Date  . Hypertension   . Hyperlipidemia   . Breast cancer [facility]) age: 29  . Diabetes mellitus ([place])     type II  . Neuropathy [facility])     Social History   Social History  . Marital Status: Single    Spouse Name: N/A  . Number of Children: N/A  . Years of Education: N/A   Occupational History  . Not on file.   Social History Main Topics  . Smoking status: Current Every Day Smoker -- 1.00 packs/day for 46 years    Types: Cigarettes  . Smokeless tobacco: Never Used  . Alcohol Use: Yes     Comment: 4-5 drinks per month  . Drug Use: No  . Sexual Activity: Not on file   Other Topics Concern  . 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   Enjoys travelling, relaxing          Past Surgical History  Procedure Laterality Date  . Cholecystectomy    . Tubal ligation    .  Breast surgery  01/24/94    left mastectomy  . Breast reconstruction  1995    left  . Cardiac catheterization  04/16/12    normal per pt    Family History  Problem Relation Age of Onset  . Stroke Father     Several   . Heart disease Father   . Cancer Father     prostate  . Stroke Sister 1  . Heart disease Sister 24    stents  . Other Mother     brain tumor  . Hyperlipidemia Sister   . Hyperlipidemia Sister     No Known Allergies  Current Outpatient Prescriptions on File Prior to Visit  Medication Sig Dispense Refill  . lisinopril (PRINIVIL,ZESTRIL) 20 MG tablet Take 0.5 tablets (10 mg total) by mouth daily. 45 tablet 1  . meloxicam (MOBIC) 15 MG tablet Take 15 mg by mouth daily as needed for pain.    . metFORMIN (GLUCOPHAGE) 1000 MG tablet TAKE 1 TABLET (1,000 MG TOTAL) BY MOUTH 2 (TWO) TIMES DAILY WITH A MEAL. 180 tablet 1  . rosuvastatin (CRESTOR) 40 MG tablet Take 1 tablet (40 mg total)  by mouth daily. 30 tablet 5  . topiramate (TOPAMAX) 100 MG tablet Take 2 tablets (200 mg total) by mouth at bedtime. 60 tablet 5  . aspirin 81 MG tablet Take 1 tablet (81 mg total) by mouth daily. (Patient not taking: Reported on 01/10/2016) 30 tablet   . glucose blood test strip 1 each by Other route daily as needed for other. Reported on 01/10/2016    . Lancets (FREESTYLE) lancets 1 each by Other route as needed for other. Reported on 01/10/2016    . valACYclovir (VALTREX) 500 MG tablet Take 500 mg by mouth 2 (two) times daily as needed. Reported on 01/10/2016     No current facility-administered medications on file prior to visit.    BP 124/73 mmHg  Pulse 80  Temp(Src) 97.8 F (36.6 C) (Oral)  Resp 16  Ht 5\' 4"  (1.626 m)  Wt 158 lb (71.668 kg)  BMI 27.11 kg/m2  SpO2 98%    Objective:   Physical Exam  Constitutional: She is oriented to person, place, and time. She appears well-developed and well-nourished.  Cardiovascular: Normal rate, regular rhythm and normal heart sounds.   No  murmur heard. Pulmonary/Chest: Effort normal and breath sounds normal. No respiratory distress. She has no wheezes.  Musculoskeletal: She exhibits no edema.  Neurological: She is alert and oriented to person, place, and time.  Psychiatric: She has a normal mood and affect. Her behavior is normal. Judgment and thought content normal.          Assessment & Plan:  zostavax today

## 2016-01-30 ENCOUNTER — Other Ambulatory Visit: Payer: Self-pay | Admitting: Family

## 2016-01-30 ENCOUNTER — Ambulatory Visit (HOSPITAL_BASED_OUTPATIENT_CLINIC_OR_DEPARTMENT_OTHER)
Admission: RE | Admit: 2016-01-30 | Discharge: 2016-01-30 | Disposition: A | Discharge status: Home / Self Care | Payer: 59 | Source: Ambulatory Visit | Attending: Family | Admitting: Family

## 2016-01-30 DIAGNOSIS — Z0000 Encounter for general adult medical examination without abnormal findings: Secondary | ICD-10-CM

## 2016-01-30 DIAGNOSIS — Z1231 Encounter for screening mammogram for malignant neoplasm of breast: Secondary | ICD-10-CM | POA: Diagnosis not present

## 2016-03-13 ENCOUNTER — Other Ambulatory Visit: Payer: Self-pay | Admitting: Family

## 2016-03-13 NOTE — Telephone Encounter (Signed)
LVM for pt advising of the below. ALSO advised pt to schedule FU with PCP

## 2016-03-13 NOTE — Telephone Encounter (Signed)
Topamax refill sent to pharmacy. Pt last seen by PCP on 01/10/16 and advised to f/u in 3 months. Pt has not scheduled yet. Please call her to schedule appt around 04/11/16.  Thanks!

## 2016-04-11 ENCOUNTER — Other Ambulatory Visit: Payer: Self-pay | Admitting: Family

## 2016-04-13 NOTE — Telephone Encounter (Signed)
Rx sent to the pharmacy by e-script.//AB/CMA 

## 2016-05-11 ENCOUNTER — Other Ambulatory Visit: Payer: Self-pay | Admitting: Family

## 2016-05-11 NOTE — Telephone Encounter (Signed)
Refill sent per [facility] refill protocol/SLS  

## 2016-05-25 ENCOUNTER — Telehealth: Payer: Self-pay | Admitting: Family

## 2016-05-25 MED ORDER — VALACYCLOVIR HCL 500 MG PO TABS: 500.0000 mg | ORAL_TABLET | Freq: Two times a day (BID) | ORAL | Status: AC | PRN

## 2016-05-25 NOTE — Telephone Encounter (Signed)
°  Relation to [id number] Call back number:[phone] Pharmacy:[facility]  Reason for call: PT IS NEEDING RX valACYclovir (VALTREX) 500 MG tablet, PT STATES SHE ONLY TAKES AS NEEDED.

## 2016-05-25 NOTE — Telephone Encounter (Signed)
Melissa--looks like medication is on med list as historical but we have not sent Rx before.  Please advise?

## 2016-06-17 ENCOUNTER — Ambulatory Visit (INDEPENDENT_AMBULATORY_CARE_PROVIDER_SITE_OTHER): Payer: 59 | Admitting: Family

## 2016-06-17 ENCOUNTER — Encounter: Payer: Self-pay | Admitting: Family

## 2016-06-17 VITALS — BP 108/70 | HR 67 | Temp 97.7°F | Resp 18 | Ht 64.0 in | Wt 154.6 lb

## 2016-06-17 DIAGNOSIS — E785 Hyperlipidemia, unspecified: Secondary | ICD-10-CM

## 2016-06-17 DIAGNOSIS — Z23 Encounter for immunization: Secondary | ICD-10-CM

## 2016-06-17 DIAGNOSIS — E1165 Type 2 diabetes mellitus with hyperglycemia: Secondary | ICD-10-CM | POA: Diagnosis not present

## 2016-06-17 DIAGNOSIS — I10 Essential (primary) hypertension: Secondary | ICD-10-CM

## 2016-06-17 DIAGNOSIS — B079 Viral wart, unspecified: Secondary | ICD-10-CM | POA: Diagnosis not present

## 2016-06-17 DIAGNOSIS — G43809 Other migraine, not intractable, without status migrainosus: Secondary | ICD-10-CM

## 2016-06-17 DIAGNOSIS — IMO0001 Reserved for inherently not codable concepts without codable children: Secondary | ICD-10-CM

## 2016-06-17 DIAGNOSIS — E119 Type 2 diabetes mellitus without complications: Secondary | ICD-10-CM

## 2016-06-17 DIAGNOSIS — Z720 Tobacco use: Secondary | ICD-10-CM

## 2016-06-17 LAB — BASIC METABOLIC PANEL
BUN: 17 mg/dL (ref 6–23)
CHLORIDE: 104 meq/L (ref 96–112)
CO2: 26 mEq/L (ref 19–32)
CREATININE: 0.68 mg/dL (ref 0.40–1.20)
Calcium: 9.9 mg/dL (ref 8.4–10.5)
GFR: 93.53 mL/min (ref >60.00)
GLUCOSE: 123 mg/dL — AB (ref 70–99)
POTASSIUM: 4.1 meq/L (ref 3.5–5.1)
Sodium: 137 mEq/L (ref 135–145)

## 2016-06-17 LAB — HEMOGLOBIN A1C: HEMOGLOBIN A1C: 7 % — AB (ref 4.6–6.5)

## 2016-06-17 MED ORDER — TOPIRAMATE 100 MG PO TABS: 200.0000 mg | ORAL_TABLET | Freq: Every day | ORAL | 5 refills | Status: DC

## 2016-06-17 MED ORDER — ROSUVASTATIN CALCIUM 40 MG PO TABS
40.0000 mg | ORAL_TABLET | Freq: Every day | ORAL | 5 refills | Status: DC
Start: 2016-06-17 — End: 2016-12-23

## 2016-06-17 NOTE — Progress Notes (Signed)
Pre visit review using our clinic review tool, if applicable. No additional management support is needed unless otherwise documented below in the visit note. 

## 2016-06-17 NOTE — Assessment & Plan Note (Signed)
Clinically stable on metformin. Advised pt to schedule follow up eye exam.  Obtain a1c.

## 2016-06-17 NOTE — Progress Notes (Signed)
Subjective:    Patient ID: Autumn May, female    DOB: 1956-10-10, 60 y.o.   MRN: [id number]  HPI  Autumn May is a 60 yr old female who presents today for follow up.  1) DM2-  Maintained on metformin.  Not checking sugars, diet is good, denies hypoglycemia.  Lab Results  Component Value Date   HGBA1C 7.1 (H) 01/10/2016   HGBA1C 7.0 (H) 10/11/2015   HGBA1C 6.9 (H) 07/12/2015   Lab Results  Component Value Date   MICROALBUR 0.9 01/10/2016   LDLCALC 69 01/10/2016   CREATININE 0.69 01/10/2016   2) HTN- maintained on lisinopril.  BP Readings from Last 3 Encounters:  06/17/16 108/70  01/10/16 124/73  08/14/15 119/75   3) Hyperlipidemia- on crestor, denies myalgia.   Lab Results  Component Value Date   CHOL 146 01/10/2016   HDL 65.20 01/10/2016   LDLCALC 69 01/10/2016   TRIG 61.0 01/10/2016   CHOLHDL 2 01/10/2016   4) migraines- maintained on topamax.  Reports no migraines on topamax.    5) Wart right 1st finger- above MIP, has been present for "quite a few years". Reports that she has tried liquid nitrogen and "wart bandaids" without improvement.  In a spot that she finds bothersome, would like removal.    Review of Systems    see HPI  Past Medical History:  Diagnosis Date  . Breast cancer [facility]) age: 79  . Diabetes mellitus ([place])    type II  . Hyperlipidemia   . Hypertension   . Neuropathy [facility])      Social History   Social History  . Marital status: Single    Spouse name: N/A  . Number of children: N/A  . Years of education: N/A   Occupational History  . Not on file.   Social History Main Topics  . Smoking status: Current Every Day Smoker    Packs/day: 1.00    Years: 46.00    Types: Cigarettes  . Smokeless tobacco: Never Used  . Alcohol use Yes     Comment: 4-5 drinks per month  . Drug use: No  . Sexual activity: Not on file   Other Topics Concern  . 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   Enjoys traveling, relaxing          Past Surgical History:  Procedure Laterality Date  . BREAST RECONSTRUCTION  1995   left  . BREAST SURGERY  01/24/94   left mastectomy  . CARDIAC CATHETERIZATION  04/16/12   normal per pt  . CHOLECYSTECTOMY    . TUBAL LIGATION      Family History  Problem Relation Age of Onset  . Stroke Father     Several   . Heart disease Father   . Cancer Father     prostate  . Stroke Sister 22  . Heart disease Sister 70    stents  . Other Mother     brain tumor  . Hyperlipidemia Sister   . Hyperlipidemia Sister     No Known Allergies  Current Outpatient Prescriptions on File Prior to Visit  Medication Sig Dispense Refill  . aspirin 81 MG tablet Take 1 tablet (81 mg total) by mouth daily. 30 tablet   . glucose blood test strip 1 each by Other route daily as needed for other. Reported on  01/10/2016    . Lancets (FREESTYLE) lancets 1 each by Other route as needed for other. Reported on 01/10/2016    . lisinopril (PRINIVIL,ZESTRIL) 20 MG tablet Take 0.5 tablets (10 mg total) by mouth daily. 45 tablet 1  . meloxicam (MOBIC) 15 MG tablet Take 15 mg by mouth daily as needed for pain.    . metFORMIN (GLUCOPHAGE) 1000 MG tablet TAKE 1 TABLET (1,000 MG TOTAL) BY MOUTH 2 (TWO) TIMES DAILY WITH A MEAL. 60 tablet 5  . rosuvastatin (CRESTOR) 40 MG tablet Take 1 tablet (40 mg total) by mouth daily. 30 tablet 5  . topiramate (TOPAMAX) 100 MG tablet TAKE TWO TABLETS BY MOUTH AT BEDTIME 60 tablet 1  . valACYclovir (VALTREX) 500 MG tablet Take 1 tablet (500 mg total) by mouth 2 (two) times daily as needed. Reported on 01/10/2016 20 tablet 1   No current facility-administered medications on file prior to visit.     BP 108/70   Pulse 67   Temp 97.7 F (36.5 C) (Oral)   Resp 18   Ht 5\' 4"  (1.626 m)   Wt 154 lb 9.6 oz (70.1 kg)   SpO2 96% Comment: room air  BMI 26.54 kg/m    Objective:   Physical Exam    Constitutional: She appears well-developed and well-nourished.  Cardiovascular: Normal rate, regular rhythm and normal heart sounds.   No murmur heard. Pulmonary/Chest: Effort normal and breath sounds normal. No respiratory distress. She has no wheezes.  Musculoskeletal: She exhibits no edema.  Skin: Skin is warm and dry.  Hypertrophic wart noted right index finger, plantar surface above MIP joint  Psychiatric: She has a normal mood and affect. Her behavior is normal. Judgment and thought content normal.          Assessment & Plan:  Td today

## 2016-06-17 NOTE — Assessment & Plan Note (Signed)
Stable, continue lisinopril 

## 2016-06-17 NOTE — Assessment & Plan Note (Signed)
LDL at goal on crestor,continue same.

## 2016-06-17 NOTE — Assessment & Plan Note (Signed)
Stable on topamax, continue same.

## 2016-06-17 NOTE — Patient Instructions (Signed)
Please complete lab work prior to leaving. We will contact you with your appointment for Dermatology. Please schedule an appointment with your eye doctor and ask them to send [place] the report.

## 2016-06-18 ENCOUNTER — Encounter: Payer: Self-pay | Admitting: Family

## 2016-07-02 ENCOUNTER — Encounter: Payer: Self-pay | Admitting: Family

## 2016-07-29 ENCOUNTER — Other Ambulatory Visit: Payer: Self-pay | Admitting: Family

## 2016-09-09 ENCOUNTER — Ambulatory Visit (INDEPENDENT_AMBULATORY_CARE_PROVIDER_SITE_OTHER): Payer: 59 | Admitting: Family

## 2016-09-09 ENCOUNTER — Encounter: Payer: Self-pay | Admitting: Family

## 2016-09-09 VITALS — BP 123/78 | HR 78 | Temp 97.7°F | Resp 18 | Ht 64.0 in | Wt 153.0 lb

## 2016-09-09 DIAGNOSIS — I10 Essential (primary) hypertension: Secondary | ICD-10-CM

## 2016-09-09 DIAGNOSIS — E119 Type 2 diabetes mellitus without complications: Secondary | ICD-10-CM | POA: Diagnosis not present

## 2016-09-09 DIAGNOSIS — E785 Hyperlipidemia, unspecified: Secondary | ICD-10-CM | POA: Diagnosis not present

## 2016-09-09 DIAGNOSIS — G43809 Other migraine, not intractable, without status migrainosus: Secondary | ICD-10-CM | POA: Diagnosis not present

## 2016-09-09 NOTE — Assessment & Plan Note (Signed)
Clinically stable on metformin. Obtain follow up A1C after 11/9.

## 2016-09-09 NOTE — Progress Notes (Signed)
Pre visit review using our clinic review tool, if applicable. No additional management support is needed unless otherwise documented below in the visit note. 

## 2016-09-09 NOTE — Progress Notes (Signed)
Subjective:    Patient ID: Autumn May, female    DOB: [birth year], 60 y.o.   MRN: [id number]  HPI  Ms.  Autumn May is a 60 yr old female who presents today for follow up.  1) DM2- maintained on metformin. Rarely checking sugars Lab Results  Component Value Date   HGBA1C 7.0 (H) 06/17/2016   HGBA1C 7.1 (H) 01/10/2016   HGBA1C 7.0 (H) 10/11/2015   Lab Results  Component Value Date   MICROALBUR 0.9 01/10/2016   LDLCALC 69 01/10/2016   CREATININE 0.68 06/17/2016   2) HTN- on lisinopril. She denies CP/SOB or swelling BP Readings from Last 3 Encounters:  09/09/16 123/78  06/17/16 108/70  01/10/16 124/73   3) hyperlipidemia- on crestor. Denies myalgia.  Lab Results  Component Value Date   CHOL 146 01/10/2016   HDL 65.20 01/10/2016   LDLCALC 69 01/10/2016   TRIG 61.0 01/10/2016   CHOLHDL 2 01/10/2016   4) migraine- on topamax.   Reports that her migraines are well controlled. Reports that one weekend she forgot her topamax and had migraine.   Review of Systems See HPI  Past Medical History:  Diagnosis Date  . Breast cancer [facility]) age: 67  . Diabetes mellitus ([place])    type II  . Hyperlipidemia   . Hypertension   . Neuropathy [facility])      Social History   Social History  . Marital status: Single    Spouse name: N/A  . Number of children: N/A  . Years of education: N/A   Occupational History  . Not on file.   Social History Main Topics  . Smoking status: Current Every Day Smoker    Packs/day: 1.00    Years: 46.00    Types: Cigarettes  . Smokeless tobacco: Never Used  . Alcohol use Yes     Comment: 4-5 drinks per month  . Drug use: No  . Sexual activity: Not on file   Other Topics Concern  . 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   Enjoys traveling, relaxing          Past Surgical History:  Procedure Laterality Date  . BREAST RECONSTRUCTION  1995   left  . BREAST SURGERY  01/24/94   left mastectomy  . CARDIAC CATHETERIZATION  04/16/12   normal per pt  . CHOLECYSTECTOMY    . TUBAL LIGATION      Family History  Problem Relation Age of Onset  . Stroke Father     Several   . Heart disease Father   . Cancer Father     prostate  . Stroke Sister 53  . Heart disease Sister 39    stents  . Other Mother     brain tumor  . Hyperlipidemia Sister   . Hyperlipidemia Sister     No Known Allergies  Current Outpatient Prescriptions on File Prior to Visit  Medication Sig Dispense Refill  . aspirin 81 MG tablet Take 1 tablet (81 mg total) by mouth daily. 30 tablet   . glucose blood test strip 1 each by Other route daily as needed for other. Reported on 01/10/2016    . Lancets (FREESTYLE) lancets 1 each by Other route as needed for other. Reported on 01/10/2016    . lisinopril (PRINIVIL,ZESTRIL) 20 MG tablet TAKE 0.5 TABLETS BY MOUTH DAILY 45 tablet 1  .  meloxicam (MOBIC) 15 MG tablet Take 15 mg by mouth daily as needed for pain.    . metFORMIN (GLUCOPHAGE) 1000 MG tablet TAKE 1 TABLET (1,000 MG TOTAL) BY MOUTH 2 (TWO) TIMES DAILY WITH A MEAL. 60 tablet 5  . rosuvastatin (CRESTOR) 40 MG tablet Take 1 tablet (40 mg total) by mouth daily. 30 tablet 5  . topiramate (TOPAMAX) 100 MG tablet Take 2 tablets (200 mg total) by mouth at bedtime. 60 tablet 5  . valACYclovir (VALTREX) 500 MG tablet Take 1 tablet (500 mg total) by mouth 2 (two) times daily as needed. Reported on 01/10/2016 20 tablet 1   No current facility-administered medications on file prior to visit.     BP 123/78 (BP Location: Right Arm, Cuff Size: Normal)   Pulse 78   Temp 97.7 F (36.5 C) (Oral)   Resp 18   Ht 5\' 4"  (1.626 m)   Wt 153 lb (69.4 kg)   SpO2 98% Comment: room air  BMI 26.26 kg/m       Objective:   Physical Exam  Constitutional: She is oriented to person, place, and time. She appears well-developed and well-nourished.  HENT:  Head: Normocephalic and  atraumatic.  Cardiovascular: Normal rate, regular rhythm and normal heart sounds.   No murmur heard. Pulmonary/Chest: Effort normal and breath sounds normal. No respiratory distress. She has no wheezes.  Musculoskeletal: She exhibits no edema.  Neurological: She is alert and oriented to person, place, and time.  Psychiatric: She has a normal mood and affect. Her behavior is normal. Judgment and thought content normal.          Assessment & Plan:

## 2016-09-09 NOTE — Assessment & Plan Note (Signed)
LDL at goal on statin, continue same.

## 2016-09-09 NOTE — Assessment & Plan Note (Signed)
Stable on topamax, continue same, obtain follow up bmet.

## 2016-09-09 NOTE — Assessment & Plan Note (Signed)
BP stable on lisinopril, continue same.

## 2016-09-17 ENCOUNTER — Other Ambulatory Visit (INDEPENDENT_AMBULATORY_CARE_PROVIDER_SITE_OTHER): Payer: 59

## 2016-09-17 DIAGNOSIS — E119 Type 2 diabetes mellitus without complications: Secondary | ICD-10-CM | POA: Diagnosis not present

## 2016-09-17 LAB — BASIC METABOLIC PANEL
BUN: 17 mg/dL (ref 6–23)
CALCIUM: 9.4 mg/dL (ref 8.4–10.5)
CO2: 25 mEq/L (ref 19–32)
CREATININE: 0.65 mg/dL (ref 0.40–1.20)
Chloride: 109 mEq/L (ref 96–112)
GFR: 98.45 mL/min (ref >60.00)
Glucose, Bld: 123 mg/dL — ABNORMAL HIGH (ref 70–99)
Potassium: 3.9 mEq/L (ref 3.5–5.1)
Sodium: 141 mEq/L (ref 135–145)

## 2016-09-17 LAB — HEMOGLOBIN A1C: HEMOGLOBIN A1C: 7 % — AB (ref 4.6–6.5)

## 2016-09-18 ENCOUNTER — Other Ambulatory Visit: Payer: Self-pay

## 2016-09-29 ENCOUNTER — Encounter: Payer: Self-pay | Admitting: Family

## 2016-09-29 NOTE — Progress Notes (Signed)
Rec'd Diabetic Eye Exam report from Dr. Rolley Sims [facility] [phone]). Spoke with [place] on 09/29/2016 regarding date discrepancy and no mention of retinopathy on report. States that she will refill refax the corrected report.

## 2016-10-10 ENCOUNTER — Other Ambulatory Visit: Payer: Self-pay | Admitting: Family

## 2016-11-13 ENCOUNTER — Encounter: Payer: Self-pay | Admitting: Medical

## 2016-11-13 ENCOUNTER — Ambulatory Visit (INDEPENDENT_AMBULATORY_CARE_PROVIDER_SITE_OTHER): Payer: BLUE CROSS/BLUE SHIELD | Admitting: Medical

## 2016-11-13 VITALS — BP 117/70 | HR 83 | Temp 97.9°F | Resp 16 | Ht 64.0 in | Wt 156.2 lb

## 2016-11-13 DIAGNOSIS — R059 Cough, unspecified: Secondary | ICD-10-CM

## 2016-11-13 DIAGNOSIS — J0100 Acute maxillary sinusitis, unspecified: Secondary | ICD-10-CM

## 2016-11-13 DIAGNOSIS — R05 Cough: Secondary | ICD-10-CM | POA: Diagnosis not present

## 2016-11-13 MED ORDER — DOXYCYCLINE HYCLATE 100 MG PO TABS: ORAL_TABLET | ORAL | 0 refills | Status: DC

## 2016-11-13 MED ORDER — BENZONATATE 100 MG PO CAPS: 100.0000 mg | ORAL_CAPSULE | Freq: Three times a day (TID) | ORAL | 0 refills | Status: DC | PRN

## 2016-11-13 MED ORDER — FLUTICASONE PROPIONATE 50 MCG/ACT NA SUSP: 2.0000 | Freq: Every day | NASAL | 1 refills | Status: DC

## 2016-11-13 NOTE — Patient Instructions (Addendum)
You appear to have a sinus infection and bronchitis. I am prescribing doxycycline antibiotic for the infection. To help with the nasal congestion I prescribed nasal steroid flonase. For your associated cough, I prescribed cough medicine benzonatate.  If bronchitis type symptoms worsen despite the above then get cxr.  Rest, hydrate, tylenol for fever.  Follow up in 7 days or as needed.

## 2016-11-13 NOTE — Progress Notes (Signed)
Subjective:    Patient ID: Autumn May, female    DOB: [birth year], 61 y.o.   MRN: [id number]  HPI  Pt in with head and chest congestion. Some sinus pain, pnd and st. Productive cough. Symptoms for 5 days.  Pt reports hx of bronchitis and sinusitis. Worse since moving from [place].   Pt is pack a day smoker.  Review of Systems  Constitutional: Positive for chills and fatigue. Negative for diaphoresis and fever.       Not sure if chill weathter.  Respiratory: Positive for shortness of breath. Negative for cough and wheezing.   Cardiovascular: Negative for chest pain and palpitations.  Gastrointestinal: Negative for abdominal pain.  Musculoskeletal: Negative for back pain and myalgias.  Skin: Negative for rash.  Neurological: Negative for dizziness.  Hematological: Negative for adenopathy. Does not bruise/bleed easily.  Psychiatric/Behavioral: Negative for confusion.    Past Medical History:  Diagnosis Date  . Breast cancer [facility]) age: 31  . Diabetes mellitus ([place])    type II  . Hyperlipidemia   . Hypertension   . Neuropathy [facility])      Social History   Social History  . Marital status: Single    Spouse name: N/A  . Number of children: N/A  . Years of education: N/A   Occupational History  . Not on file.   Social History Main Topics  . Smoking status: Current Every Day Smoker    Packs/day: 1.00    Years: 46.00    Types: Cigarettes  . Smokeless tobacco: Never Used  . Alcohol use Yes     Comment: 4-5 drinks per month  . Drug use: No  . Sexual activity: Not on file   Other Topics Concern  . 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   Enjoys traveling, relaxing          Past Surgical History:  Procedure Laterality Date  . BREAST RECONSTRUCTION  1995   left  . BREAST SURGERY  01/24/94   left mastectomy  . CARDIAC CATHETERIZATION  04/16/12   normal per pt  .  CHOLECYSTECTOMY    . TUBAL LIGATION      Family History  Problem Relation Age of Onset  . Stroke Father     Several   . Heart disease Father   . Cancer Father     prostate  . Stroke Sister 7  . Heart disease Sister 53    stents  . Other Mother     brain tumor  . Hyperlipidemia Sister   . Hyperlipidemia Sister     No Known Allergies  Current Outpatient Prescriptions on File Prior to Visit  Medication Sig Dispense Refill  . glucose blood test strip 1 each by Other route daily as needed for other. Reported on 01/10/2016    . Lancets (FREESTYLE) lancets 1 each by Other route as needed for other. Reported on 01/10/2016    . lisinopril (PRINIVIL,ZESTRIL) 20 MG tablet TAKE 0.5 TABLETS BY MOUTH DAILY 45 tablet 1  . meloxicam (MOBIC) 15 MG tablet Take 15 mg by mouth daily as needed for pain.    . metFORMIN (GLUCOPHAGE) 1000 MG tablet TAKE ONE TABLET BY MOUTH TWO TIMES A DAY WITH MEALS 60 tablet 2  . rosuvastatin (CRESTOR) 40 MG tablet Take 1 tablet (40 mg total) by mouth daily. 30 tablet  5  . topiramate (TOPAMAX) 100 MG tablet Take 2 tablets (200 mg total) by mouth at bedtime. 60 tablet 5  . valACYclovir (VALTREX) 500 MG tablet Take 1 tablet (500 mg total) by mouth 2 (two) times daily as needed. Reported on 01/10/2016 20 tablet 1  . aspirin 81 MG tablet Take 1 tablet (81 mg total) by mouth daily. (Patient not taking: Reported on 11/13/2016) 30 tablet    No current facility-administered medications on file prior to visit.     BP 117/70 (BP Location: Left Arm, Patient Position: Sitting, Cuff Size: Large)   Pulse 83   Temp 97.9 F (36.6 C) (Oral)   Resp 16   Ht 5\' 4"  (1.626 m)   Wt 156 lb 4 oz (70.9 kg)   SpO2 98%   BMI 26.82 kg/m      Objective:   Physical Exam  General  Mental Status - Alert. General Appearance - Well groomed. Not in acute distress.  Skin Rashes- No Rashes.  HEENT Head- Normal. Ear Auditory Canal - Left- Normal. Right - Normal.Tympanic Membrane- Left-  Normal. Right- Normal. Eye Sclera/Conjunctiva- Left- Normal. Right- Normal. Nose & Sinuses Nasal Mucosa- Left-  Boggy and Congested. Right-  Boggy and  Congested.Bilateral maxillary and frontal sinus pressure. Mouth & Throat Lips: Upper Lip- Normal: no dryness, cracking, pallor, cyanosis, or vesicular eruption. Lower Lip-Normal: no dryness, cracking, pallor, cyanosis or vesicular eruption. Buccal Mucosa- Bilateral- No Aphthous ulcers. Oropharynx- No Discharge or Erythema. Tonsils: Characteristics- Bilateral- No Erythema or Congestion. Size/Enlargement- Bilateral- No enlargement. Discharge- bilateral-None.  Neck Neck- Supple. No Masses.   Chest and Lung Exam Auscultation: Breath Sounds:-Clear even and unlabored.  Cardiovascular Auscultation:Rythm- Regular, rate and rhythm. Murmurs & Other Heart Sounds:Ausculatation of the heart reveal- No Murmurs.  Lymphatic Head & Neck General Head & Neck Lymphatics: Bilateral: Description- No Localized lymphadenopathy.       Assessment & Plan:  You appear to have a sinus infection. I am prescribing antibiotic for the infection. To help with the nasal congestion I prescribed nasal steroid. For your associated cough, I prescribed cough medicine benzonatate.  If bronchitis type symptoms worsen despite the above then get cxr.  Rest, hydrate, tylenol for fever.  Follow up in 7 days or as needed.  Autumn May, Percell Miller, PA-C

## 2016-11-13 NOTE — Progress Notes (Signed)
Pre visit review using our clinic review tool, if applicable. No additional management support is needed unless otherwise documented below in the visit note/SLS  

## 2016-11-16 ENCOUNTER — Encounter: Payer: Self-pay | Admitting: Family

## 2016-12-15 ENCOUNTER — Other Ambulatory Visit: Payer: Self-pay | Admitting: Internal Medicine

## 2016-12-15 ENCOUNTER — Other Ambulatory Visit: Payer: Self-pay | Admitting: Family

## 2016-12-15 DIAGNOSIS — Z1231 Encounter for screening mammogram for malignant neoplasm of breast: Secondary | ICD-10-CM

## 2016-12-22 ENCOUNTER — Encounter: Payer: Self-pay | Admitting: Family

## 2016-12-23 ENCOUNTER — Encounter: Payer: Self-pay | Admitting: Family

## 2016-12-23 MED ORDER — METFORMIN HCL 1000 MG PO TABS: 1000.0000 mg | ORAL_TABLET | Freq: Two times a day (BID) | ORAL | 1 refills | Status: DC

## 2016-12-23 MED ORDER — LISINOPRIL 20 MG PO TABS: 10.0000 mg | ORAL_TABLET | Freq: Every day | ORAL | 1 refills | Status: DC

## 2016-12-23 MED ORDER — ROSUVASTATIN CALCIUM 40 MG PO TABS: 40.0000 mg | ORAL_TABLET | Freq: Every day | ORAL | 1 refills | Status: AC

## 2016-12-23 MED ORDER — TOPIRAMATE 100 MG PO TABS: 200.0000 mg | ORAL_TABLET | Freq: Every day | ORAL | 1 refills | Status: AC

## 2017-01-04 ENCOUNTER — Telehealth: Payer: Self-pay | Admitting: *Deleted

## 2017-01-04 NOTE — Telephone Encounter (Signed)
Received fax from [facility] requesting refill of topiramate. Per patient email of 12/23/16, she is using mail order ([facility]). Will send message to pt to determine if she needs a 2 week supply until mail order arrives or if this is requested in error.

## 2017-01-04 NOTE — Telephone Encounter (Signed)
Faxed response to [facility] to cancel Rx request. Pt received mail order supply.

## 2017-01-08 ENCOUNTER — Other Ambulatory Visit: Payer: Self-pay | Admitting: Family

## 2017-01-08 ENCOUNTER — Ambulatory Visit (INDEPENDENT_AMBULATORY_CARE_PROVIDER_SITE_OTHER): Payer: BLUE CROSS/BLUE SHIELD | Admitting: Family

## 2017-01-08 ENCOUNTER — Encounter: Payer: Self-pay | Admitting: Family

## 2017-01-08 VITALS — BP 117/69 | HR 94 | Temp 98.2°F | Resp 16 | Ht 64.0 in | Wt 156.0 lb

## 2017-01-08 DIAGNOSIS — I10 Essential (primary) hypertension: Secondary | ICD-10-CM | POA: Diagnosis not present

## 2017-01-08 DIAGNOSIS — G43809 Other migraine, not intractable, without status migrainosus: Secondary | ICD-10-CM | POA: Diagnosis not present

## 2017-01-08 DIAGNOSIS — E119 Type 2 diabetes mellitus without complications: Secondary | ICD-10-CM | POA: Diagnosis not present

## 2017-01-08 DIAGNOSIS — E785 Hyperlipidemia, unspecified: Secondary | ICD-10-CM | POA: Diagnosis not present

## 2017-01-08 LAB — COMPREHENSIVE METABOLIC PANEL
ALBUMIN: 4.4 g/dL (ref 3.5–5.2)
ALT: 25 U/L (ref 0–35)
AST: 19 U/L (ref 0–37)
Alkaline Phosphatase: 75 U/L (ref 39–117)
BUN: 15 mg/dL (ref 6–23)
CHLORIDE: 109 meq/L (ref 96–112)
CO2: 24 mEq/L (ref 19–32)
CREATININE: 0.65 mg/dL (ref 0.40–1.20)
Calcium: 9.5 mg/dL (ref 8.4–10.5)
GFR: 98.35 mL/min (ref >60.00)
Glucose, Bld: 120 mg/dL — ABNORMAL HIGH (ref 70–99)
Potassium: 3.6 mEq/L (ref 3.5–5.1)
SODIUM: 140 meq/L (ref 135–145)
Total Bilirubin: 0.3 mg/dL (ref 0.2–1.2)
Total Protein: 7.4 g/dL (ref 6.0–8.3)

## 2017-01-08 LAB — LIPID PANEL
CHOL/HDL RATIO: 2
CHOLESTEROL: 145 mg/dL (ref 0–200)
HDL: 58.8 mg/dL (ref >39.00)
LDL Cholesterol: 71 mg/dL (ref 0–99)
NONHDL: 86.22
Triglycerides: 75 mg/dL (ref 0.0–149.0)
VLDL: 15 mg/dL (ref 0.0–40.0)

## 2017-01-08 LAB — MICROALBUMIN / CREATININE URINE RATIO
CREATININE, U: 190.1 mg/dL
MICROALB UR: 1.6 mg/dL (ref 0.0–1.9)
Microalb Creat Ratio: 0.8 mg/g (ref 0.0–30.0)

## 2017-01-08 LAB — HEMOGLOBIN A1C: Hgb A1c MFr Bld: 7.2 % — ABNORMAL HIGH (ref 4.6–6.5)

## 2017-01-08 MED ORDER — LISINOPRIL 10 MG PO TABS: 10.0000 mg | ORAL_TABLET | Freq: Every day | ORAL | Status: AC

## 2017-01-08 NOTE — Assessment & Plan Note (Signed)
BP is stable on lisinopril 10mg . Continue same.

## 2017-01-08 NOTE — Assessment & Plan Note (Signed)
Obtain follow up lipid panel.

## 2017-01-08 NOTE — Patient Instructions (Addendum)
Please complete lab work prior to leaving.   

## 2017-01-08 NOTE — Assessment & Plan Note (Signed)
Stable.  Continue metformin, obtain a1c.

## 2017-01-08 NOTE — Progress Notes (Signed)
Subjective:    Patient ID: Autumn May, female    DOB: [birth year], 61 y.o.   MRN: [id number]  HPI  [name] is a 61 yr old female who presents today for follow up.  1) DM2- currently maintained on metformin 1000mg  bid.  Diet is fair.  Checks sugars at home- generally 120's in the AM.   Lab Results  Component Value Date   HGBA1C 7.0 (H) 09/17/2016   HGBA1C 7.0 (H) 06/17/2016   HGBA1C 7.1 (H) 01/10/2016   Lab Results  Component Value Date   MICROALBUR 0.9 01/10/2016   LDLCALC 69 01/10/2016   CREATININE 0.65 09/17/2016   2) HTN- maintained on lisinopril 20mg . Taking 1/2 tab once daily.  BP Readings from Last 3 Encounters:  01/08/17 117/69  11/13/16 117/70  09/09/16 123/78   3) Hyperlipidemia- maintained on crestor 40mg .  Lab Results  Component Value Date   CHOL 146 01/10/2016   HDL 65.20 01/10/2016   LDLCALC 69 01/10/2016   TRIG 61.0 01/10/2016   CHOLHDL 2 01/10/2016   4) Migraines- maintained on topamax. Reports that headaches are well controlled.   Review of Systems  Respiratory: Negative for cough and shortness of breath.   Cardiovascular: Negative for chest pain.  Musculoskeletal: Negative for myalgias.   Past Medical History:  Diagnosis Date  . Breast cancer [facility]) age: 53  . Diabetes mellitus ([place])    type II  . Hyperlipidemia   . Hypertension   . Neuropathy [facility])      Social History   Social History  . Marital status: Single    Spouse name: N/A  . Number of children: N/A  . Years of education: N/A   Occupational History  . Not on file.   Social History Main Topics  . Smoking status: Current Every Day Smoker    Packs/day: 1.00    Years: 46.00    Types: Cigarettes  . Smokeless tobacco: Never Used  . Alcohol use Yes     Comment: 4-5 drinks per month  . Drug use: No  . Sexual activity: Not on file   Other Topics Concern  . 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   Enjoys traveling, relaxing          Past Surgical History:  Procedure Laterality Date  . BREAST RECONSTRUCTION  1995   left  . BREAST SURGERY  01/24/94   left mastectomy  . CARDIAC CATHETERIZATION  04/16/12   normal per pt  . CHOLECYSTECTOMY    . TUBAL LIGATION      Family History  Problem Relation Age of Onset  . Stroke Father     Several   . Heart disease Father   . Cancer Father     prostate  . Stroke Sister 5  . Heart disease Sister 29    stents  . Other Mother     brain tumor  . Hyperlipidemia Sister   . Hyperlipidemia Sister     No Known Allergies  Current Outpatient Prescriptions on File Prior to Visit  Medication Sig Dispense Refill  . glucose blood test strip 1 each by Other route daily as needed for other. Reported on 01/10/2016    . Lancets (FREESTYLE) lancets 1 each by Other route as needed for other. Reported on 01/10/2016    . meloxicam (MOBIC) 15 MG tablet Take 15  mg by mouth daily as needed for pain.    . metFORMIN (GLUCOPHAGE) 1000 MG tablet Take 1 tablet (1,000 mg total) by mouth 2 (two) times daily with a meal. 180 tablet 1  . rosuvastatin (CRESTOR) 40 MG tablet Take 1 tablet (40 mg total) by mouth daily. 90 tablet 1  . topiramate (TOPAMAX) 100 MG tablet Take 2 tablets (200 mg total) by mouth at bedtime. 180 tablet 1  . valACYclovir (VALTREX) 500 MG tablet Take 1 tablet (500 mg total) by mouth 2 (two) times daily as needed. Reported on 01/10/2016 20 tablet 1   No current facility-administered medications on file prior to visit.     BP 117/69 (BP Location: Right Arm, Cuff Size: Normal)   Pulse 94   Temp 98.2 F (36.8 C) (Oral)   Resp 16   Ht 5\' 4"  (1.626 m)   Wt 156 lb (70.8 kg)   LMP 03/06/1995   SpO2 96%   BMI 26.78 kg/m       Objective:   Physical Exam  Constitutional: She is oriented to person, place, and time. She appears well-developed and well-nourished.  HENT:  Head: Normocephalic and atraumatic.    Cardiovascular: Normal rate, regular rhythm and normal heart sounds.   No murmur heard. Pulmonary/Chest: Effort normal and breath sounds normal. No respiratory distress. She has no wheezes.  Lymphadenopathy:    She has no cervical adenopathy.  Neurological: She is alert and oriented to person, place, and time.  Psychiatric: She has a normal mood and affect. Her behavior is normal. Judgment and thought content normal.          Assessment & Plan:

## 2017-01-08 NOTE — Assessment & Plan Note (Signed)
Stable on topamax, continue same, obtain follow up bicar/electrolytes, Cr.

## 2017-01-08 NOTE — Progress Notes (Signed)
Pre visit review using our clinic review tool, if applicable. No additional management support is needed unless otherwise documented below in the visit note. 

## 2017-01-08 NOTE — Telephone Encounter (Signed)
Sugar above goal.  Add actos 30mg  once daily. Please see if she wants it to go to mail order for 90 days or to her local pharmacy. Continue to work on diabetic diet/exercise. Cholesterol, liver and kidneys look good.

## 2017-01-11 ENCOUNTER — Ambulatory Visit (INDEPENDENT_AMBULATORY_CARE_PROVIDER_SITE_OTHER): Payer: BLUE CROSS/BLUE SHIELD | Admitting: Family

## 2017-01-11 ENCOUNTER — Encounter: Payer: Self-pay | Admitting: Family

## 2017-01-11 VITALS — BP 107/69 | HR 95 | Temp 98.9°F | Resp 17 | Ht 64.0 in | Wt 154.2 lb

## 2017-01-11 DIAGNOSIS — J029 Acute pharyngitis, unspecified: Secondary | ICD-10-CM

## 2017-01-11 DIAGNOSIS — R05 Cough: Secondary | ICD-10-CM

## 2017-01-11 DIAGNOSIS — R6889 Other general symptoms and signs: Secondary | ICD-10-CM | POA: Diagnosis not present

## 2017-01-11 DIAGNOSIS — R059 Cough, unspecified: Secondary | ICD-10-CM

## 2017-01-11 LAB — POC INFLUENZA A&B (BINAX/QUICKVUE): Influenza A, POC: NEGATIVE

## 2017-01-11 LAB — POCT RAPID STREP A (OFFICE): RAPID STREP A SCREEN: NEGATIVE

## 2017-01-11 MED ORDER — PIOGLITAZONE HCL 30 MG PO TABS: 30.0000 mg | ORAL_TABLET | Freq: Every day | ORAL | 1 refills | Status: AC

## 2017-01-11 NOTE — Progress Notes (Signed)
Subjective:    Patient ID: Autumn May, female    DOB: [birth year], 61 y.o.   MRN: [id number]  HPI  Autumn May is a 61 yr old female who presents today with chief complaint of fever/cough/chills and sore throat. Tmax 100.8.  Sore throat is "pretty bad." She reports + ear pain.    Review of Systems See HPI  Past Medical History:  Diagnosis Date  . Breast cancer [facility]) age: 56  . Diabetes mellitus ([place])    type II  . Hyperlipidemia   . Hypertension   . Neuropathy [facility])      Social History   Social History  . Marital status: Single    Spouse name: N/A  . Number of children: N/A  . Years of education: N/A   Occupational History  . Not on file.   Social History Main Topics  . Smoking status: Current Every Day Smoker    Packs/day: 1.00    Years: 46.00    Types: Cigarettes  . Smokeless tobacco: Never Used  . Alcohol use Yes     Comment: 4-5 drinks per month  . Drug use: No  . Sexual activity: Not on file   Other Topics Concern  . 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   Enjoys traveling, relaxing          Past Surgical History:  Procedure Laterality Date  . BREAST RECONSTRUCTION  1995   left  . BREAST SURGERY  01/24/94   left mastectomy  . CARDIAC CATHETERIZATION  04/16/12   normal per pt  . CHOLECYSTECTOMY    . TUBAL LIGATION      Family History  Problem Relation Age of Onset  . Stroke Father     Several   . Heart disease Father   . Cancer Father     prostate  . Stroke Sister 31  . Heart disease Sister 30    stents  . Other Mother     brain tumor  . Hyperlipidemia Sister   . Hyperlipidemia Sister     No Known Allergies  Current Outpatient Prescriptions on File Prior to Visit  Medication Sig Dispense Refill  . glucose blood test strip 1 each by Other route daily as needed for other. Reported on 01/10/2016    . Lancets (FREESTYLE) lancets 1  each by Other route as needed for other. Reported on 01/10/2016    . lisinopril (PRINIVIL,ZESTRIL) 10 MG tablet Take 1 tablet (10 mg total) by mouth daily.    . meloxicam (MOBIC) 15 MG tablet Take 15 mg by mouth daily as needed for pain.    . metFORMIN (GLUCOPHAGE) 1000 MG tablet Take 1 tablet (1,000 mg total) by mouth 2 (two) times daily with a meal. 180 tablet 1  . pioglitazone (ACTOS) 30 MG tablet Take 1 tablet (30 mg total) by mouth daily. 90 tablet 1  . rosuvastatin (CRESTOR) 40 MG tablet Take 1 tablet (40 mg total) by mouth daily. 90 tablet 1  . topiramate (TOPAMAX) 100 MG tablet Take 2 tablets (200 mg total) by mouth at bedtime. 180 tablet 1  . valACYclovir (VALTREX) 500 MG tablet Take 1 tablet (500 mg total) by mouth 2 (two) times daily as needed. Reported on 01/10/2016 20 tablet 1   No current facility-administered medications on file prior to visit.     BP  107/69 (BP Location: Right Arm, Patient Position: Sitting, Cuff Size: Normal)   Pulse 95   Temp 98.9 F (37.2 C) (Oral)   Resp 17   Ht 5\' 4"  (1.626 m)   Wt 154 lb 3.2 oz (69.9 kg)   LMP 03/06/1995   SpO2 97%   BMI 26.47 kg/m       Objective:   Physical Exam  Constitutional: She is oriented to person, place, and time. She appears well-developed and well-nourished. No distress.  HENT:  Head: Normocephalic and atraumatic.  Mouth/Throat: Posterior oropharyngeal erythema present. No posterior oropharyngeal edema.  Cardiovascular: Normal rate and regular rhythm.   No murmur heard. Pulmonary/Chest: Effort normal and breath sounds normal. No respiratory distress. She has no wheezes. She has no rales. She exhibits no tenderness.  Musculoskeletal: She exhibits no edema.  Neurological: She is alert and oriented to person, place, and time.  Psychiatric: She has a normal mood and affect. Her behavior is normal. Judgment and thought content normal.          Assessment & Plan:  Flu like Illness-  Flu swab is negative.  Strep  testing is negative. Advised pt on follow up/supportive measures as follows: Please call if new/worsening symptoms or if not improved in 3 days.  You may use ibuprofen and cepacol lozenges as needed for throat pain.  For cough you may use delsym which is available over the counter.

## 2017-01-11 NOTE — Telephone Encounter (Signed)
Called the patient informed of results/instructions. She agreed to start the actos and preferred it go to mail order for #90 day supply. She verbalized understanding of results/instructions.

## 2017-01-11 NOTE — Patient Instructions (Addendum)
Please call if new/worsening symptoms or if not improved in 3 days.  You may use ibuprofen and cepacol lozenges as needed for throat pain.  For cough you may use delsym which is available over the counter.   Viral Respiratory Infection A respiratory infection is an illness that affects part of the respiratory system, such as the lungs, nose, or throat. Most respiratory infections are caused by either viruses or bacteria. A respiratory infection that is caused by a virus is called a viral respiratory infection. Common types of viral respiratory infections include:  A cold.  The flu (influenza).  A respiratory syncytial virus (RSV) infection. How do I know if I have a viral respiratory infection? Most viral respiratory infections cause:  A stuffy or runny nose.  Yellow or green nasal discharge.  A cough.  Sneezing.  Fatigue.  Achy muscles.  A sore throat.  Sweating or chills.  A fever.  A headache. How are viral respiratory infections treated? If influenza is diagnosed early, it may be treated with an antiviral medicine that shortens the length of time a person has symptoms. Symptoms of viral respiratory infections may be treated with over-the-counter and prescription medicines, such as:  Expectorants. These make it easier to cough up mucus.  Decongestant nasal sprays. Health care providers do not prescribe antibiotic medicines for viral infections. This is because antibiotics are designed to kill bacteria. They have no effect on viruses. How do I know if I should stay home from work or school? To avoid exposing others to your respiratory infection, stay home if you have:  A fever.  A persistent cough.  A sore throat.  A runny nose.  Sneezing.  Muscles aches.  Headaches.  Fatigue.  Weakness.  Chills.  Sweating.  Nausea. Follow these instructions at home:  Rest as much as possible.  Take over-the-counter and prescription medicines only as told by  your health care provider.  Drink enough fluid to keep your urine clear or pale yellow. This helps prevent dehydration and helps loosen up mucus.  Gargle with a salt-water mixture 3-4 times per day or as needed. To make a salt-water mixture, completely dissolve -1 tsp of salt in 1 cup of warm water.  Use nose drops made from salt water to ease congestion and soften raw skin around your nose.  Do not drink alcohol.  Do not use tobacco products, including cigarettes, chewing tobacco, and e-cigarettes. If you need help quitting, ask your health care provider. Contact a health care provider if:  Your symptoms last for 10 days or longer.  Your symptoms get worse over time.  You have a fever.  You have severe sinus pain in your face or forehead.  The glands in your jaw or neck become very swollen. Get help right away if:  You feel pain or pressure in your chest.  You have shortness of breath.  You faint or feel like you will faint.  You have severe and persistent vomiting.  You feel confused or disoriented. This information is not intended to replace advice given to you by your health care provider. Make sure you discuss any questions you have with your health care provider. Document Released: 08/05/2005 Document Revised: 04/02/2016 Document Reviewed: 04/03/2015 Elsevier Interactive Patient Education  2017 [employer].

## 2017-01-11 NOTE — Progress Notes (Signed)
Pre visit review using our clinic review tool, if applicable. No additional management support is needed unless otherwise documented below in the visit note. 

## 2017-01-12 ENCOUNTER — Telehealth: Payer: Self-pay | Admitting: Family

## 2017-01-12 NOTE — Telephone Encounter (Signed)
Pt lab results are now released  to my chart.

## 2017-01-12 NOTE — Telephone Encounter (Signed)
Relation to [id number] Call back number:[phone]  Reason for call:  Patient requesting lab results release on My Chart, please advise

## 2017-01-19 ENCOUNTER — Other Ambulatory Visit: Payer: Self-pay | Admitting: Family

## 2017-02-02 ENCOUNTER — Ambulatory Visit (HOSPITAL_BASED_OUTPATIENT_CLINIC_OR_DEPARTMENT_OTHER): Payer: Self-pay

## 2017-04-16 ENCOUNTER — Ambulatory Visit (INDEPENDENT_AMBULATORY_CARE_PROVIDER_SITE_OTHER): Payer: BLUE CROSS/BLUE SHIELD | Admitting: Family

## 2017-04-16 ENCOUNTER — Encounter: Payer: Self-pay | Admitting: Family

## 2017-04-16 VITALS — BP 117/74 | HR 77 | Temp 98.0°F | Resp 16 | Ht 62.0 in | Wt 152.2 lb

## 2017-04-16 DIAGNOSIS — G43809 Other migraine, not intractable, without status migrainosus: Secondary | ICD-10-CM | POA: Diagnosis not present

## 2017-04-16 DIAGNOSIS — I10 Essential (primary) hypertension: Secondary | ICD-10-CM | POA: Diagnosis not present

## 2017-04-16 DIAGNOSIS — E785 Hyperlipidemia, unspecified: Secondary | ICD-10-CM

## 2017-04-16 DIAGNOSIS — E118 Type 2 diabetes mellitus with unspecified complications: Secondary | ICD-10-CM | POA: Diagnosis not present

## 2017-04-16 LAB — BASIC METABOLIC PANEL
BUN: 17 mg/dL (ref 6–23)
CALCIUM: 9.8 mg/dL (ref 8.4–10.5)
CO2: 24 mEq/L (ref 19–32)
CREATININE: 0.68 mg/dL (ref 0.40–1.20)
Chloride: 107 mEq/L (ref 96–112)
GFR: 93.27 mL/min (ref >60.00)
GLUCOSE: 113 mg/dL — AB (ref 70–99)
Potassium: 4 mEq/L (ref 3.5–5.1)
SODIUM: 138 meq/L (ref 135–145)

## 2017-04-16 LAB — HEMOGLOBIN A1C: Hgb A1c MFr Bld: 7 % — ABNORMAL HIGH (ref 4.6–6.5)

## 2017-04-16 NOTE — Progress Notes (Signed)
Subjective:    Patient ID: Autumn May, female    DOB: 1956-07-22, 61 y.o.   MRN: [id number]  HPI  Autumn May is a 61 yr old female who presents today for follow up. She moved to [place] 2 weeks ago and plans to establish with a new provider closer to home after today's visit.   1) DM2-  mainatined on actos, metformin.  Not checking sugars. Lab Results  Component Value Date   HGBA1C 7.2 (H) 01/08/2017   HGBA1C 7.0 (H) 09/17/2016   HGBA1C 7.0 (H) 06/17/2016   Lab Results  Component Value Date   MICROALBUR 1.6 01/08/2017   LDLCALC 71 01/08/2017   CREATININE 0.65 01/08/2017   2) HTN- maintained on lisinopril.  BP Readings from Last 3 Encounters:  04/16/17 117/74  01/11/17 107/69  01/08/17 117/69   3) Hyperlipidemia- maintained on crestor 40mg . Denies myalgias Lab Results  Component Value Date   CHOL 145 01/08/2017   HDL 58.80 01/08/2017   LDLCALC 71 01/08/2017   TRIG 75.0 01/08/2017   CHOLHDL 2 01/08/2017   4) Migraines- maitained on topamax. Denies recent migraines.    Review of Systems See HPI  Past Medical History:  Diagnosis Date  . Breast cancer [facility]) age: 62  . Diabetes mellitus ([place])    type II  . Hyperlipidemia   . Hypertension   . Neuropathy      Social History   Social History  . Marital status: Single    Spouse name: N/A  . Number of children: N/A  . Years of education: N/A   Occupational History  . Not on file.   Social History Main Topics  . Smoking status: Current Every Day Smoker    Packs/day: 1.00    Years: 46.00    Types: Cigarettes  . Smokeless tobacco: Never Used  . Alcohol use Yes     Comment: 4-5 drinks per month  . Drug use: No  . Sexual activity: Not on file   Other Topics Concern  . 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   Enjoys traveling, relaxing          Past Surgical History:  Procedure Laterality  Date  . BREAST RECONSTRUCTION  1995   left  . BREAST SURGERY  01/24/94   left mastectomy  . CARDIAC CATHETERIZATION  04/16/12   normal per pt  . CHOLECYSTECTOMY    . TUBAL LIGATION      Family History  Problem Relation Age of Onset  . Stroke Father        Several   . Heart disease Father   . Cancer Father        prostate  . Stroke Sister 31  . Heart disease Sister 68       stents  . Other Mother        brain tumor  . Hyperlipidemia Sister   . Hyperlipidemia Sister     No Known Allergies  Current Outpatient Prescriptions on File Prior to Visit  Medication Sig Dispense Refill  . glucose blood test strip 1 each by Other route daily as needed for other. Reported on 01/10/2016    . Lancets (FREESTYLE) lancets 1 each by Other route as needed for other. Reported on 01/10/2016    . lisinopril (PRINIVIL,ZESTRIL) 10 MG tablet Take 1 tablet (10 mg total) by  mouth daily.    . meloxicam (MOBIC) 15 MG tablet Take 15 mg by mouth daily as needed for pain.    . metFORMIN (GLUCOPHAGE) 1000 MG tablet Take 1 tablet (1,000 mg total) by mouth 2 (two) times daily with a meal. 180 tablet 1  . pioglitazone (ACTOS) 30 MG tablet Take 1 tablet (30 mg total) by mouth daily. 90 tablet 1  . rosuvastatin (CRESTOR) 40 MG tablet Take 1 tablet (40 mg total) by mouth daily. 90 tablet 1  . topiramate (TOPAMAX) 100 MG tablet Take 2 tablets (200 mg total) by mouth at bedtime. 180 tablet 1  . valACYclovir (VALTREX) 500 MG tablet Take 1 tablet (500 mg total) by mouth 2 (two) times daily as needed. Reported on 01/10/2016 20 tablet 1   No current facility-administered medications on file prior to visit.     BP 117/74 (BP Location: Right Arm, Cuff Size: Normal)   Pulse 77   Temp 98 F (36.7 C) (Oral)   Resp 16   Ht 5\' 2"  (1.575 m)   Wt 152 lb 3.2 oz (69 kg)   LMP 03/06/1995   SpO2 98%   BMI 27.84 kg/m       Objective:   Physical Exam        Assessment & Plan:

## 2017-04-16 NOTE — Assessment & Plan Note (Signed)
At goal, tolerating statin. Continue same.

## 2017-04-16 NOTE — Patient Instructions (Addendum)
Please complete lab work prior to leaving.  Good luck in [place]!

## 2017-04-16 NOTE — Assessment & Plan Note (Signed)
BP stable, continue lisinopril. Obtain follow up A1C.

## 2017-04-16 NOTE — Assessment & Plan Note (Signed)
Stable on topamax. Continue same.  ?

## 2017-04-16 NOTE — Assessment & Plan Note (Signed)
Obtain follow up A1C. Continue current meds.

## 2017-04-28 ENCOUNTER — Telehealth: Payer: Self-pay | Admitting: *Deleted

## 2017-04-28 NOTE — Telephone Encounter (Signed)
Pt signed records release to have her records sent to [facility], fax [phone] as she is moving to [place]. Release forwarded to Medical Records.

## 2017-06-26 ENCOUNTER — Other Ambulatory Visit: Payer: Self-pay | Admitting: Family

## 2017-06-29 ENCOUNTER — Other Ambulatory Visit: Payer: Self-pay

## 2017-06-29 MED ORDER — METFORMIN HCL 1000 MG PO TABS: 1000.0000 mg | ORAL_TABLET | Freq: Two times a day (BID) | ORAL | 1 refills | Status: AC

## 2017-09-05 ENCOUNTER — Other Ambulatory Visit: Payer: Self-pay | Admitting: Family

## 2017-09-06 ENCOUNTER — Other Ambulatory Visit: Payer: Self-pay | Admitting: Family
